# Patient Record
Sex: Male | Born: 1955 | Race: Black or African American | Hispanic: No | Marital: Single
Health system: Southern US, Community
[De-identification: ages and names within clinical notes are randomized; demographics above are authoritative.]

---

## 1999-07-18 ENCOUNTER — Encounter: Payer: Self-pay | Admitting: General Practice

## 1999-07-18 ENCOUNTER — Encounter: Admission: RE | Admit: 1999-07-18 | Discharge: 1999-07-18 | Payer: Self-pay | Admitting: General Practice

## 2011-10-29 ENCOUNTER — Emergency Department (HOSPITAL_COMMUNITY)
Admission: EM | Admit: 2011-10-29 | Discharge: 2011-10-29 | Disposition: A | Discharge status: Home / Self Care | Payer: No Typology Code available for payment source | Attending: Emergency Medicine | Admitting: Emergency Medicine

## 2011-10-29 ENCOUNTER — Emergency Department (HOSPITAL_COMMUNITY): Payer: No Typology Code available for payment source

## 2011-10-29 ENCOUNTER — Encounter (HOSPITAL_COMMUNITY): Payer: Self-pay | Admitting: Emergency Medicine

## 2011-10-29 DIAGNOSIS — M542 Cervicalgia: Secondary | ICD-10-CM | POA: Insufficient documentation

## 2011-10-29 DIAGNOSIS — M545 Low back pain, unspecified: Secondary | ICD-10-CM | POA: Insufficient documentation

## 2011-10-29 DIAGNOSIS — M549 Dorsalgia, unspecified: Secondary | ICD-10-CM

## 2011-10-29 DIAGNOSIS — M25569 Pain in unspecified knee: Secondary | ICD-10-CM | POA: Insufficient documentation

## 2011-10-29 MED ORDER — IBUPROFEN 600 MG PO TABS
600.0000 mg | ORAL_TABLET | Freq: Four times a day (QID) | ORAL | Status: AC | PRN
Start: 2011-10-29 — End: 2011-11-08

## 2011-10-29 MED ORDER — HYDROCODONE-ACETAMINOPHEN 5-500 MG PO TABS: 1.0000 | ORAL_TABLET | Freq: Four times a day (QID) | ORAL | Status: AC | PRN

## 2011-10-29 MED ORDER — HYDROCODONE-ACETAMINOPHEN 5-325 MG PO TABS: 1.0000 | ORAL_TABLET | Freq: Once | ORAL | Status: DC

## 2011-10-29 NOTE — ED Notes (Signed)
Patient transported to X-ray 
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
## 2011-10-29 NOTE — ED Notes (Signed)
Pt calling for ride home 
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
## 2012-11-24 ENCOUNTER — Emergency Department: Payer: Self-pay | Admitting: Emergency Medicine

## 2012-11-24 LAB — URINALYSIS, COMPLETE
Bacteria: NONE SEEN
Bilirubin,UR: NEGATIVE
Blood: NEGATIVE
Leukocyte Esterase: NEGATIVE
Nitrite: NEGATIVE
Ph: 5 (ref 4.5–8.0)
Protein: NEGATIVE
Specific Gravity: 1.028 (ref 1.003–1.030)
Squamous Epithelial: NONE SEEN
WBC UR: 2 /HPF (ref 0–5)

## 2012-11-24 LAB — COMPREHENSIVE METABOLIC PANEL
Albumin: 3.9 g/dL (ref 3.4–5.0)
Alkaline Phosphatase: 136 U/L (ref 50–136)
Anion Gap: 7 (ref 7–16)
BUN: 16 mg/dL (ref 7–18)
Bilirubin,Total: 0.4 mg/dL (ref 0.2–1.0)
Calcium, Total: 9.3 mg/dL (ref 8.5–10.1)
Chloride: 107 mmol/L (ref 98–107)
Co2: 28 mmol/L (ref 21–32)
Creatinine: 1.02 mg/dL (ref 0.60–1.30)
Glucose: 131 mg/dL — ABNORMAL HIGH (ref 65–99)
Osmolality: 286 (ref 275–301)
Potassium: 3.7 mmol/L (ref 3.5–5.1)
SGOT(AST): 25 U/L (ref 15–37)
SGPT (ALT): 25 U/L (ref 12–78)
Sodium: 142 mmol/L (ref 136–145)
Total Protein: 8 g/dL (ref 6.4–8.2)

## 2012-11-24 LAB — CBC
HCT: 42.8 % (ref 40.0–52.0)
HGB: 14.2 g/dL (ref 13.0–18.0)
MCH: 29.4 pg (ref 26.0–34.0)
MCHC: 33.2 g/dL (ref 32.0–36.0)
MCV: 89 fL (ref 80–100)
Platelet: 279 10*3/uL (ref 150–440)
RBC: 4.83 10*6/uL (ref 4.40–5.90)
RDW: 14.9 % — ABNORMAL HIGH (ref 11.5–14.5)
WBC: 3.7 10*3/uL — ABNORMAL LOW (ref 3.8–10.6)

## 2016-10-27 ENCOUNTER — Encounter (HOSPITAL_COMMUNITY): Payer: Self-pay

## 2016-10-27 ENCOUNTER — Emergency Department (HOSPITAL_COMMUNITY)
Admission: EM | Admit: 2016-10-27 | Discharge: 2016-10-27 | Disposition: A | Discharge status: Home / Self Care | Payer: No Typology Code available for payment source | Attending: Emergency Medicine | Admitting: Emergency Medicine

## 2016-10-27 DIAGNOSIS — Y92410 Unspecified street and highway as the place of occurrence of the external cause: Secondary | ICD-10-CM | POA: Diagnosis not present

## 2016-10-27 DIAGNOSIS — M7918 Myalgia, other site: Secondary | ICD-10-CM

## 2016-10-27 DIAGNOSIS — M791 Myalgia: Secondary | ICD-10-CM | POA: Diagnosis not present

## 2016-10-27 DIAGNOSIS — Y939 Activity, unspecified: Secondary | ICD-10-CM | POA: Diagnosis not present

## 2016-10-27 DIAGNOSIS — M545 Low back pain: Secondary | ICD-10-CM | POA: Insufficient documentation

## 2016-10-27 DIAGNOSIS — Y999 Unspecified external cause status: Secondary | ICD-10-CM | POA: Insufficient documentation

## 2016-10-27 MED ORDER — NAPROXEN 500 MG PO TABS: 500.0000 mg | ORAL_TABLET | Freq: Two times a day (BID) | ORAL | 0 refills | Status: DC

## 2016-10-27 MED ORDER — METHOCARBAMOL 500 MG PO TABS: 500.0000 mg | ORAL_TABLET | Freq: Every evening | ORAL | 0 refills | Status: DC | PRN
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
## 2016-10-27 NOTE — ED Notes (Signed)
Pt states understanding discharge instructions and electronic signature pad broken

## 2017-05-30 ENCOUNTER — Emergency Department (HOSPITAL_COMMUNITY)
Admission: EM | Admit: 2017-05-30 | Discharge: 2017-05-30 | Disposition: A | Discharge status: Home / Self Care | Payer: No Typology Code available for payment source | Attending: Emergency Medicine | Admitting: Emergency Medicine

## 2017-05-30 DIAGNOSIS — Y999 Unspecified external cause status: Secondary | ICD-10-CM | POA: Diagnosis not present

## 2017-05-30 DIAGNOSIS — Y92410 Unspecified street and highway as the place of occurrence of the external cause: Secondary | ICD-10-CM | POA: Insufficient documentation

## 2017-05-30 DIAGNOSIS — Z79899 Other long term (current) drug therapy: Secondary | ICD-10-CM | POA: Diagnosis not present

## 2017-05-30 DIAGNOSIS — M791 Myalgia: Secondary | ICD-10-CM | POA: Insufficient documentation

## 2017-05-30 DIAGNOSIS — Y9389 Activity, other specified: Secondary | ICD-10-CM | POA: Diagnosis not present

## 2017-05-30 DIAGNOSIS — R0781 Pleurodynia: Secondary | ICD-10-CM | POA: Insufficient documentation

## 2017-05-30 DIAGNOSIS — M7918 Myalgia, other site: Secondary | ICD-10-CM

## 2017-05-30 MED ORDER — IBUPROFEN 600 MG PO TABS: 600.0000 mg | ORAL_TABLET | Freq: Four times a day (QID) | ORAL | 0 refills | Status: DC | PRN

## 2017-05-30 NOTE — ED Notes (Signed)
Patient verbalized understanding of discharge instructions and denies any further needs or questions at this time. VS stable. Patient ambulatory with steady gait.  
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
## 2021-04-05 ENCOUNTER — Emergency Department (HOSPITAL_COMMUNITY): Payer: No Typology Code available for payment source

## 2021-04-05 ENCOUNTER — Other Ambulatory Visit: Payer: Self-pay

## 2021-04-05 ENCOUNTER — Emergency Department (HOSPITAL_COMMUNITY)
Admission: EM | Admit: 2021-04-05 | Discharge: 2021-04-05 | Disposition: A | Discharge status: Home / Self Care | Payer: No Typology Code available for payment source | Attending: Emergency Medicine | Admitting: Emergency Medicine

## 2021-04-05 ENCOUNTER — Encounter (HOSPITAL_COMMUNITY): Payer: Self-pay

## 2021-04-05 DIAGNOSIS — M25571 Pain in right ankle and joints of right foot: Secondary | ICD-10-CM | POA: Insufficient documentation

## 2021-04-05 DIAGNOSIS — S59912A Unspecified injury of left forearm, initial encounter: Secondary | ICD-10-CM | POA: Diagnosis present

## 2021-04-05 DIAGNOSIS — M542 Cervicalgia: Secondary | ICD-10-CM | POA: Diagnosis not present

## 2021-04-05 DIAGNOSIS — M791 Myalgia, unspecified site: Secondary | ICD-10-CM | POA: Insufficient documentation

## 2021-04-05 DIAGNOSIS — M79662 Pain in left lower leg: Secondary | ICD-10-CM | POA: Diagnosis not present

## 2021-04-05 DIAGNOSIS — M7918 Myalgia, other site: Secondary | ICD-10-CM

## 2021-04-05 DIAGNOSIS — T148XXA Other injury of unspecified body region, initial encounter: Secondary | ICD-10-CM

## 2021-04-05 DIAGNOSIS — M25512 Pain in left shoulder: Secondary | ICD-10-CM | POA: Diagnosis not present

## 2021-04-05 DIAGNOSIS — Y92410 Unspecified street and highway as the place of occurrence of the external cause: Secondary | ICD-10-CM | POA: Insufficient documentation

## 2021-04-05 DIAGNOSIS — S50812A Abrasion of left forearm, initial encounter: Secondary | ICD-10-CM | POA: Insufficient documentation

## 2021-04-05 MED ORDER — METHOCARBAMOL 500 MG PO TABS: 500.0000 mg | ORAL_TABLET | Freq: Two times a day (BID) | ORAL | 0 refills | Status: AC

## 2021-04-05 MED ORDER — BACITRACIN ZINC 500 UNIT/GM EX OINT: TOPICAL_OINTMENT | Freq: Once | CUTANEOUS | Status: DC

## 2021-04-05 MED ORDER — OXYCODONE-ACETAMINOPHEN 5-325 MG PO TABS
1.0000 | ORAL_TABLET | Freq: Once | ORAL | Status: AC
  Administered 2021-04-05: 1 via ORAL
  Filled 2021-04-05: qty 1
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
## 2021-04-05 NOTE — ED Notes (Signed)
Patient transported to X-ray 
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
## 2022-06-11 ENCOUNTER — Inpatient Hospital Stay (HOSPITAL_COMMUNITY)
Admission: EM | Admit: 2022-06-11 | Discharge: 2022-06-29 | DRG: 064 | Disposition: E | Payer: Self-pay | Attending: Internal Medicine | Admitting: Internal Medicine

## 2022-06-11 ENCOUNTER — Emergency Department (HOSPITAL_COMMUNITY): Payer: Self-pay

## 2022-06-11 ENCOUNTER — Other Ambulatory Visit: Payer: Self-pay

## 2022-06-11 DIAGNOSIS — E876 Hypokalemia: Secondary | ICD-10-CM | POA: Diagnosis present

## 2022-06-11 DIAGNOSIS — Z781 Physical restraint status: Secondary | ICD-10-CM

## 2022-06-11 DIAGNOSIS — E860 Dehydration: Secondary | ICD-10-CM | POA: Diagnosis present

## 2022-06-11 DIAGNOSIS — R911 Solitary pulmonary nodule: Secondary | ICD-10-CM | POA: Diagnosis present

## 2022-06-11 DIAGNOSIS — I429 Cardiomyopathy, unspecified: Secondary | ICD-10-CM | POA: Diagnosis present

## 2022-06-11 DIAGNOSIS — E1165 Type 2 diabetes mellitus with hyperglycemia: Secondary | ICD-10-CM | POA: Diagnosis present

## 2022-06-11 DIAGNOSIS — I63413 Cerebral infarction due to embolism of bilateral middle cerebral arteries: Secondary | ICD-10-CM | POA: Diagnosis present

## 2022-06-11 DIAGNOSIS — I248 Other forms of acute ischemic heart disease: Secondary | ICD-10-CM | POA: Diagnosis present

## 2022-06-11 DIAGNOSIS — I63433 Cerebral infarction due to embolism of bilateral posterior cerebral arteries: Principal | ICD-10-CM | POA: Diagnosis present

## 2022-06-11 DIAGNOSIS — Z66 Do not resuscitate: Secondary | ICD-10-CM | POA: Diagnosis present

## 2022-06-11 DIAGNOSIS — E785 Hyperlipidemia, unspecified: Secondary | ICD-10-CM | POA: Diagnosis present

## 2022-06-11 DIAGNOSIS — D735 Infarction of spleen: Secondary | ICD-10-CM | POA: Diagnosis present

## 2022-06-11 DIAGNOSIS — Z681 Body mass index (BMI) 19 or less, adult: Secondary | ICD-10-CM

## 2022-06-11 DIAGNOSIS — R64 Cachexia: Secondary | ICD-10-CM | POA: Diagnosis present

## 2022-06-11 DIAGNOSIS — M6282 Rhabdomyolysis: Secondary | ICD-10-CM

## 2022-06-11 DIAGNOSIS — R680 Hypothermia, not associated with low environmental temperature: Secondary | ICD-10-CM | POA: Diagnosis present

## 2022-06-11 DIAGNOSIS — I5041 Acute combined systolic (congestive) and diastolic (congestive) heart failure: Secondary | ICD-10-CM | POA: Diagnosis present

## 2022-06-11 DIAGNOSIS — I96 Gangrene, not elsewhere classified: Secondary | ICD-10-CM

## 2022-06-11 DIAGNOSIS — I493 Ventricular premature depolarization: Secondary | ICD-10-CM | POA: Diagnosis not present

## 2022-06-11 DIAGNOSIS — I2699 Other pulmonary embolism without acute cor pulmonale: Secondary | ICD-10-CM | POA: Diagnosis present

## 2022-06-11 DIAGNOSIS — I513 Intracardiac thrombosis, not elsewhere classified: Secondary | ICD-10-CM | POA: Diagnosis present

## 2022-06-11 DIAGNOSIS — R7881 Bacteremia: Secondary | ICD-10-CM | POA: Diagnosis present

## 2022-06-11 DIAGNOSIS — I998 Other disorder of circulatory system: Secondary | ICD-10-CM

## 2022-06-11 DIAGNOSIS — I82442 Acute embolism and thrombosis of left tibial vein: Secondary | ICD-10-CM | POA: Diagnosis present

## 2022-06-11 DIAGNOSIS — E870 Hyperosmolality and hypernatremia: Secondary | ICD-10-CM | POA: Diagnosis present

## 2022-06-11 DIAGNOSIS — E119 Type 2 diabetes mellitus without complications: Secondary | ICD-10-CM

## 2022-06-11 DIAGNOSIS — I82409 Acute embolism and thrombosis of unspecified deep veins of unspecified lower extremity: Secondary | ICD-10-CM

## 2022-06-11 DIAGNOSIS — R54 Age-related physical debility: Secondary | ICD-10-CM | POA: Diagnosis present

## 2022-06-11 DIAGNOSIS — R7401 Elevation of levels of liver transaminase levels: Secondary | ICD-10-CM | POA: Diagnosis present

## 2022-06-11 DIAGNOSIS — Y92009 Unspecified place in unspecified non-institutional (private) residence as the place of occurrence of the external cause: Secondary | ICD-10-CM

## 2022-06-11 DIAGNOSIS — E872 Acidosis, unspecified: Secondary | ICD-10-CM | POA: Diagnosis present

## 2022-06-11 DIAGNOSIS — T796XXA Traumatic ischemia of muscle, initial encounter: Secondary | ICD-10-CM | POA: Diagnosis present

## 2022-06-11 DIAGNOSIS — E871 Hypo-osmolality and hyponatremia: Secondary | ICD-10-CM | POA: Diagnosis not present

## 2022-06-11 DIAGNOSIS — I70203 Unspecified atherosclerosis of native arteries of extremities, bilateral legs: Secondary | ICD-10-CM | POA: Diagnosis present

## 2022-06-11 DIAGNOSIS — N179 Acute kidney failure, unspecified: Secondary | ICD-10-CM | POA: Diagnosis present

## 2022-06-11 DIAGNOSIS — N280 Ischemia and infarction of kidney: Secondary | ICD-10-CM | POA: Diagnosis present

## 2022-06-11 DIAGNOSIS — Z515 Encounter for palliative care: Secondary | ICD-10-CM

## 2022-06-11 DIAGNOSIS — E11649 Type 2 diabetes mellitus with hypoglycemia without coma: Secondary | ICD-10-CM | POA: Diagnosis not present

## 2022-06-11 DIAGNOSIS — R1310 Dysphagia, unspecified: Secondary | ICD-10-CM | POA: Diagnosis present

## 2022-06-11 DIAGNOSIS — R4182 Altered mental status, unspecified: Principal | ICD-10-CM

## 2022-06-11 DIAGNOSIS — R29717 NIHSS score 17: Secondary | ICD-10-CM | POA: Diagnosis present

## 2022-06-11 DIAGNOSIS — G9341 Metabolic encephalopathy: Secondary | ICD-10-CM | POA: Diagnosis present

## 2022-06-11 DIAGNOSIS — E43 Unspecified severe protein-calorie malnutrition: Secondary | ICD-10-CM | POA: Diagnosis present

## 2022-06-11 DIAGNOSIS — E1152 Type 2 diabetes mellitus with diabetic peripheral angiopathy with gangrene: Secondary | ICD-10-CM | POA: Diagnosis present

## 2022-06-11 DIAGNOSIS — I70209 Unspecified atherosclerosis of native arteries of extremities, unspecified extremity: Secondary | ICD-10-CM

## 2022-06-11 DIAGNOSIS — B9561 Methicillin susceptible Staphylococcus aureus infection as the cause of diseases classified elsewhere: Secondary | ICD-10-CM | POA: Diagnosis present

## 2022-06-11 DIAGNOSIS — I82452 Acute embolism and thrombosis of left peroneal vein: Secondary | ICD-10-CM | POA: Diagnosis present

## 2022-06-11 DIAGNOSIS — Z20822 Contact with and (suspected) exposure to covid-19: Secondary | ICD-10-CM | POA: Diagnosis present

## 2022-06-11 DIAGNOSIS — I6340 Cerebral infarction due to embolism of unspecified cerebral artery: Secondary | ICD-10-CM

## 2022-06-11 DIAGNOSIS — L8990 Pressure ulcer of unspecified site, unspecified stage: Secondary | ICD-10-CM | POA: Insufficient documentation

## 2022-06-11 DIAGNOSIS — I82432 Acute embolism and thrombosis of left popliteal vein: Secondary | ICD-10-CM | POA: Diagnosis present

## 2022-06-11 LAB — I-STAT VENOUS BLOOD GAS, ED
Acid-Base Excess: 4 mmol/L — ABNORMAL HIGH (ref 0.0–2.0)
Acid-Base Excess: 7 mmol/L — ABNORMAL HIGH (ref 0.0–2.0)
Bicarbonate: 29 mmol/L — ABNORMAL HIGH (ref 20.0–28.0)
Bicarbonate: 32.3 mmol/L — ABNORMAL HIGH (ref 20.0–28.0)
Calcium, Ion: 1.04 mmol/L — ABNORMAL LOW (ref 1.15–1.40)
Calcium, Ion: 0.97 mmol/L — ABNORMAL LOW (ref 1.15–1.40)
HCT: 48 % (ref 39.0–52.0)
HCT: 42 % (ref 39.0–52.0)
Hemoglobin: 16.3 g/dL (ref 13.0–17.0)
Hemoglobin: 14.3 g/dL (ref 13.0–17.0)
O2 Saturation: 91 %
O2 Saturation: 98 %
Potassium: 3.3 mmol/L — ABNORMAL LOW (ref 3.5–5.1)
Potassium: 6.7 mmol/L (ref 3.5–5.1)
Sodium: 165 mmol/L (ref 135–145)
Sodium: 161 mmol/L (ref 135–145)
TCO2: 30 mmol/L (ref 22–32)
TCO2: 34 mmol/L — ABNORMAL HIGH (ref 22–32)
pCO2, Ven: 44 mmHg (ref 44–60)
pCO2, Ven: 44.9 mmHg (ref 44–60)
pH, Ven: 7.427 (ref 7.25–7.43)
pH, Ven: 7.464 — ABNORMAL HIGH (ref 7.25–7.43)
pO2, Ven: 59 mmHg — ABNORMAL HIGH (ref 32–45)
pO2, Ven: 101 mmHg — ABNORMAL HIGH (ref 32–45)

## 2022-06-11 LAB — URINALYSIS, ROUTINE W REFLEX MICROSCOPIC
Bilirubin Urine: NEGATIVE
Glucose, UA: >=500 mg/dL — AB
Ketones, ur: 20 mg/dL — AB
Leukocytes,Ua: NEGATIVE
Nitrite: NEGATIVE
Protein, ur: 30 mg/dL — AB
Specific Gravity, Urine: 1.027 (ref 1.005–1.030)
pH: 5 (ref 5.0–8.0)

## 2022-06-11 LAB — BASIC METABOLIC PANEL
Anion gap: 13 (ref 5–15)
BUN: 13 mg/dL (ref 8–23)
CO2: 27 mmol/L (ref 22–32)
Calcium: 8.2 mg/dL — ABNORMAL LOW (ref 8.9–10.3)
Chloride: 123 mmol/L — ABNORMAL HIGH (ref 98–111)
Creatinine, Ser: 0.82 mg/dL (ref 0.61–1.24)
GFR, Estimated: >60 mL/min (ref >60)
Glucose, Bld: 346 mg/dL — ABNORMAL HIGH (ref 70–99)
Potassium: 2.9 mmol/L — ABNORMAL LOW (ref 3.5–5.1)
Sodium: 163 mmol/L (ref 135–145)

## 2022-06-11 LAB — COMPREHENSIVE METABOLIC PANEL
ALT: 87 U/L — ABNORMAL HIGH (ref 0–44)
AST: 77 U/L — ABNORMAL HIGH (ref 15–41)
Albumin: 2.4 g/dL — ABNORMAL LOW (ref 3.5–5.0)
Alkaline Phosphatase: 124 U/L (ref 38–126)
Anion gap: 12 (ref 5–15)
BUN: 14 mg/dL (ref 8–23)
CO2: 26 mmol/L (ref 22–32)
Calcium: 8.5 mg/dL — ABNORMAL LOW (ref 8.9–10.3)
Chloride: 126 mmol/L — ABNORMAL HIGH (ref 98–111)
Creatinine, Ser: 0.8 mg/dL (ref 0.61–1.24)
GFR, Estimated: >60 mL/min (ref >60)
Glucose, Bld: 327 mg/dL — ABNORMAL HIGH (ref 70–99)
Potassium: 3.6 mmol/L (ref 3.5–5.1)
Sodium: 164 mmol/L (ref 135–145)
Total Bilirubin: 1.9 mg/dL — ABNORMAL HIGH (ref 0.3–1.2)
Total Protein: 6.9 g/dL (ref 6.5–8.1)

## 2022-06-11 LAB — RAPID URINE DRUG SCREEN, HOSP PERFORMED
Amphetamines: NOT DETECTED
Barbiturates: NOT DETECTED
Benzodiazepines: NOT DETECTED
Cocaine: NOT DETECTED
Opiates: NOT DETECTED
Tetrahydrocannabinol: NOT DETECTED

## 2022-06-11 LAB — CK: Total CK: 2897 U/L — ABNORMAL HIGH (ref 49–397)

## 2022-06-11 LAB — LACTIC ACID, PLASMA
Lactic Acid, Venous: 2.3 mmol/L (ref 0.5–1.9)
Lactic Acid, Venous: 2.1 mmol/L (ref 0.5–1.9)

## 2022-06-11 LAB — DIC (DISSEMINATED INTRAVASCULAR COAGULATION)PANEL
D-Dimer, Quant: 11.34 ug/mL-FEU — ABNORMAL HIGH (ref 0.00–0.50)
Fibrinogen: 714 mg/dL — ABNORMAL HIGH (ref 210–475)
INR: 1.3 — ABNORMAL HIGH (ref 0.8–1.2)
Platelets: 186 10*3/uL (ref 150–400)
Prothrombin Time: 16 seconds — ABNORMAL HIGH (ref 11.4–15.2)
Smear Review: NONE SEEN
aPTT: 25 seconds (ref 24–36)

## 2022-06-11 LAB — CBG MONITORING, ED
Glucose-Capillary: 295 mg/dL — ABNORMAL HIGH (ref 70–99)
Glucose-Capillary: 304 mg/dL — ABNORMAL HIGH (ref 70–99)

## 2022-06-11 LAB — CBC WITH DIFFERENTIAL/PLATELET
Abs Immature Granulocytes: 0.09 10*3/uL — ABNORMAL HIGH (ref 0.00–0.07)
Basophils Absolute: 0 10*3/uL (ref 0.0–0.1)
Basophils Relative: 0 %
Eosinophils Absolute: 0 10*3/uL (ref 0.0–0.5)
Eosinophils Relative: 0 %
HCT: 52.8 % — ABNORMAL HIGH (ref 39.0–52.0)
Hemoglobin: 15.8 g/dL (ref 13.0–17.0)
Immature Granulocytes: 1 %
Lymphocytes Relative: 7 %
Lymphs Abs: 0.7 10*3/uL (ref 0.7–4.0)
MCH: 30.3 pg (ref 26.0–34.0)
MCHC: 29.9 g/dL — ABNORMAL LOW (ref 30.0–36.0)
MCV: 101.1 fL — ABNORMAL HIGH (ref 80.0–100.0)
Monocytes Absolute: 0.4 10*3/uL (ref 0.1–1.0)
Monocytes Relative: 4 %
Neutro Abs: 9 10*3/uL — ABNORMAL HIGH (ref 1.7–7.7)
Neutrophils Relative %: 88 %
Platelets: 195 10*3/uL (ref 150–400)
RBC: 5.22 MIL/uL (ref 4.22–5.81)
RDW: 13.8 % (ref 11.5–15.5)
WBC: 10.3 10*3/uL (ref 4.0–10.5)
nRBC: 0 % (ref 0.0–0.2)

## 2022-06-11 LAB — RESP PANEL BY RT-PCR (FLU A&B, COVID) ARPGX2
Influenza A by PCR: NEGATIVE
Influenza B by PCR: NEGATIVE
SARS Coronavirus 2 by RT PCR: NEGATIVE

## 2022-06-11 LAB — PROTIME-INR
INR: 1.2 (ref 0.8–1.2)
Prothrombin Time: 15.1 seconds (ref 11.4–15.2)

## 2022-06-11 LAB — SODIUM
Sodium: 163 mmol/L (ref 135–145)
Sodium: 161 mmol/L (ref 135–145)
Sodium: 164 mmol/L (ref 135–145)

## 2022-06-11 LAB — HEPARIN LEVEL (UNFRACTIONATED): Heparin Unfractionated: 0.49 IU/mL (ref 0.30–0.70)

## 2022-06-11 LAB — ETHANOL: Alcohol, Ethyl (B): <10 mg/dL (ref <10)

## 2022-06-11 LAB — MAGNESIUM: Magnesium: 2 mg/dL (ref 1.7–2.4)

## 2022-06-11 LAB — APTT: aPTT: 24 seconds (ref 24–36)

## 2022-06-11 LAB — TROPONIN I (HIGH SENSITIVITY)
Troponin I (High Sensitivity): 596 ng/L (ref <18)
Troponin I (High Sensitivity): 690 ng/L (ref <18)

## 2022-06-11 MED ORDER — SODIUM CHLORIDE 0.9 % IV SOLN
2.0000 g | Freq: Once | INTRAVENOUS | Status: AC
  Administered 2022-06-11: 2 g via INTRAVENOUS
  Filled 2022-06-11: qty 12.5

## 2022-06-11 MED ORDER — HEPARIN BOLUS VIA INFUSION
4000.0000 [IU] | Freq: Once | INTRAVENOUS | Status: AC
  Administered 2022-06-11: 4000 [IU] via INTRAVENOUS
  Filled 2022-06-11: qty 4000

## 2022-06-11 MED ORDER — IOHEXOL 350 MG/ML SOLN
100.0000 mL | Freq: Once | INTRAVENOUS | Status: AC | PRN
  Administered 2022-06-11: 75 mL via INTRAVENOUS

## 2022-06-11 MED ORDER — LACTATED RINGERS IV SOLN: INTRAVENOUS | Status: DC

## 2022-06-11 MED ORDER — FLUMAZENIL 0.5 MG/5ML IV SOLN
0.2000 mg | Freq: Once | INTRAVENOUS | Status: AC
  Administered 2022-06-11: 0.2 mg via INTRAVENOUS
  Filled 2022-06-11: qty 5

## 2022-06-11 MED ORDER — INSULIN ASPART 100 UNIT/ML IJ SOLN
10.0000 [IU] | Freq: Once | INTRAMUSCULAR | Status: AC
  Administered 2022-06-11: 10 [IU] via SUBCUTANEOUS

## 2022-06-11 MED ORDER — VANCOMYCIN HCL 500 MG/100ML IV SOLN: 500.0000 mg | Freq: Two times a day (BID) | INTRAVENOUS | Status: DC

## 2022-06-11 MED ORDER — VANCOMYCIN HCL IN DEXTROSE 1-5 GM/200ML-% IV SOLN
1000.0000 mg | Freq: Once | INTRAVENOUS | Status: AC
  Administered 2022-06-11: 1000 mg via INTRAVENOUS
  Filled 2022-06-11: qty 200

## 2022-06-11 MED ORDER — DOCUSATE SODIUM 100 MG PO CAPS: 100.0000 mg | ORAL_CAPSULE | Freq: Two times a day (BID) | ORAL | Status: DC | PRN

## 2022-06-11 MED ORDER — HEPARIN (PORCINE) 25000 UT/250ML-% IV SOLN
950.0000 [IU]/h | INTRAVENOUS | Status: DC
  Administered 2022-06-11: 950 [IU]/h via INTRAVENOUS
  Filled 2022-06-11: qty 250

## 2022-06-11 MED ORDER — LACTATED RINGERS IV BOLUS
2000.0000 mL | Freq: Once | INTRAVENOUS | Status: AC
  Administered 2022-06-11: 2000 mL via INTRAVENOUS

## 2022-06-11 MED ORDER — LORAZEPAM 2 MG/ML IJ SOLN
1.0000 mg | Freq: Once | INTRAMUSCULAR | Status: AC
  Administered 2022-06-11: 1 mg via INTRAVENOUS
  Filled 2022-06-11: qty 1

## 2022-06-11 MED ORDER — DEXTROSE 5 % IV SOLN: INTRAVENOUS | Status: DC

## 2022-06-11 MED ORDER — POLYETHYLENE GLYCOL 3350 17 G PO PACK: 17.0000 g | PACK | Freq: Every day | ORAL | Status: DC | PRN

## 2022-06-11 MED ORDER — CHLORHEXIDINE GLUCONATE CLOTH 2 % EX PADS
6.0000 | MEDICATED_PAD | Freq: Once | CUTANEOUS | Status: AC
  Administered 2022-06-11: 6 via TOPICAL

## 2022-06-11 MED ORDER — INSULIN ASPART 100 UNIT/ML IJ SOLN
2.0000 [IU] | INTRAMUSCULAR | Status: DC
  Administered 2022-06-12 (×5): 4 [IU] via SUBCUTANEOUS
  Administered 2022-06-12: 2 [IU] via SUBCUTANEOUS
  Administered 2022-06-13: 4 [IU] via SUBCUTANEOUS
  Administered 2022-06-13 (×2): 2 [IU] via SUBCUTANEOUS
  Administered 2022-06-13: 4 [IU] via SUBCUTANEOUS
  Administered 2022-06-13: 2 [IU] via SUBCUTANEOUS
  Administered 2022-06-14 (×2): 4 [IU] via SUBCUTANEOUS
  Administered 2022-06-14: 2 [IU] via SUBCUTANEOUS
  Administered 2022-06-14 (×3): 4 [IU] via SUBCUTANEOUS
  Administered 2022-06-15: 2 [IU] via SUBCUTANEOUS
  Administered 2022-06-15: 6 [IU] via SUBCUTANEOUS
  Administered 2022-06-15: 2 [IU] via SUBCUTANEOUS
  Administered 2022-06-15 (×2): 4 [IU] via SUBCUTANEOUS
  Administered 2022-06-15: 6 [IU] via SUBCUTANEOUS
  Administered 2022-06-16: 4 [IU] via SUBCUTANEOUS
  Administered 2022-06-16: 6 [IU] via SUBCUTANEOUS
  Administered 2022-06-16: 4 [IU] via SUBCUTANEOUS
  Administered 2022-06-16 (×2): 6 [IU] via SUBCUTANEOUS
  Administered 2022-06-17: 2 [IU] via SUBCUTANEOUS
  Administered 2022-06-17: 4 [IU] via SUBCUTANEOUS

## 2022-06-11 MED ORDER — CHLORHEXIDINE GLUCONATE CLOTH 2 % EX PADS
6.0000 | MEDICATED_PAD | Freq: Every morning | CUTANEOUS | Status: DC
  Administered 2022-06-12 – 2022-06-17 (×6): 6 via TOPICAL

## 2022-06-11 NOTE — Progress Notes (Signed)
eLink Physician-Brief Progress Note Patient Name: Alexander Greene DOB: [birth year] MRN: [id number]   Date of Service  06/12/2022  HPI/Events of Note  Notified of hyperglycemia at 304 and  hypernatremia.  Na 165 --> 161 since 1100 today.   eICU Interventions  Start on insulin sliding scale for glucose control.  Start LR @ 50cc/hr.  Follow serum Na closely.     Intervention Category Intermediate Interventions: Electrolyte abnormality - evaluation and management;Other:  Larinda Buttery 05/30/2022, 9:46 PM

## 2022-06-11 NOTE — ED Notes (Signed)
Critical care at bedside  
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
## 2022-06-11 NOTE — Progress Notes (Signed)
ANTICOAGULATION CONSULT NOTE  Pharmacy Consult for heparin Indication: pulmonary embolus  No Known Allergies  Patient Measurements: Height: 5\' 11"  (180.3 cm) Weight: 54.4 kg (119 lb 14.9 oz) IBW/kg (Calculated) : 75.3 Heparin Dosing Weight: 54.4kg  Vital Signs: Temp: 98.9 F (37.2 C) (09/13 2212) Temp Source: Oral (09/13 2212) BP: 110/83 (09/13 2300) Pulse Rate: 95 (09/13 2300)  Labs: Recent Labs    06/23/2022 1108 2022/06/23 1139 June 23, 2022 1312 06/23/22 1517 06/23/2022 1548 06-23-22 1715 06-23-2022 2227  HGB 15.8 16.3  --  14.3  --   --   --   HCT 52.8* 48.0  --  42.0  --   --   --   PLT 195  --   --   --  186  --   --   APTT 24  --   --   --  25  --   --   LABPROT 15.1  --   --   --  16.0*  --   --   INR 1.2  --   --   --  1.3*  --   --   HEPARINUNFRC  --   --   --   --   --   --  0.49  CREATININE 0.80  --   --   --   --  0.82  --   CKTOTAL 2,897*  --   --   --   --   --   --   TROPONINIHS  --   --  596* 690*  --   --   --      Estimated Creatinine Clearance: 69.1 mL/min (by C-G formula based on SCr of 0.82 mg/dL).   Medical History: No past medical history on file.  Medications:  Medications Prior to Admission  Medication Sig Dispense Refill Last Dose   ibuprofen (ADVIL,MOTRIN) 600 MG tablet Take 1 tablet (600 mg total) by mouth every 6 (six) hours as needed. (Patient not taking: Reported on 04/05/2021) 30 tablet 0    methocarbamol (ROBAXIN) 500 MG tablet Take 1 tablet (500 mg total) by mouth 2 (two) times daily. 20 tablet 0    naproxen (NAPROSYN) 500 MG tablet Take 1 tablet (500 mg total) by mouth 2 (two) times daily with a meal. (Patient not taking: Reported on 04/05/2021) 30 tablet 0    Scheduled:   [START ON 06/12/2022] Chlorhexidine Gluconate Cloth  6 each Topical q morning   insulin aspart  2-6 Units Subcutaneous Q4H    Assessment: 66 yoM who was brought in by EMS, found down at his home and had altered mental status. PMH is unknown at this time. CT showed  multiple clots. Pharmacy is being consulted to dose heparin for pulmonary embolism. Hgb and plts stable. No PTA anticoagulants.  Initial heparin level is therapeutic at 0.47 - will check confirmatory level with am labs.  Goal of Therapy:  Heparin level 0.3-0.7 units/ml Monitor platelets by anticoagulation protocol: Yes   Plan:  Continue heparin 950 units/h Daily heparin level and CBC  06/14/2022, PharmD, [place], [facility] Clinical Pharmacist [phone] Please check AMION for all [facility] Pharmacy numbers Jun 23, 2022

## 2022-06-11 NOTE — Progress Notes (Signed)
eLink Physician-Brief Progress Note Patient Name: TORRIN CRIHFIELD DOB: 08/17/1956 MRN: [id number]   Date of Service  06/24/2022  HPI/Events of Note  66/M brought in after he was found unresponsive by his neighbor.  Pt opens his eyes but does not follow commands.  He was noted to be hypernatremic, hyperglycemic, elevated CK and troponin.  Pt also with lactic acidosis at 2.3. He had ischemic bilateral lower extremities with no palpable pulses.  Pt given IVFs and started on heparin gtt.   BP 136/83, HR 103, RR 15, O2 sats 97% on RA.   Na 165 --> 163 --> 161 K 6.7 --> 2.9.  Troponin 596--> 690, CK 2897 Lactate 2.1  eICU Interventions  Admit to ICU.  Start on insulin sliding scale for glucose control. Check POC q4hrs.  Start on LR @ 50cc/hr.  Trend troponin, CK, lactate.  Continue heparin gtt.  Vascular surgery consulted. Ok to apply wet to dry dressing on bilateral lower ectremitites.  Pt is on heparin gtt.  Recheck BMP tonight and will correct electrolytes as needed.     Intervention Category Evaluation Type: New Patient Evaluation  Larinda Buttery 06/02/2022, 10:20 PM

## 2022-06-11 NOTE — ED Triage Notes (Signed)
Pt BIB EMS from home. Pt was found down on the ground by his roommate. Last seen two days ago. No blood thinners, no known medical history. Pt has infected wounds to the legs.   CBG 400 143/80 Given on NS

## 2022-06-11 NOTE — ED Notes (Signed)
EDP and Vascular at bedside.

## 2022-06-11 NOTE — Progress Notes (Signed)
Pharmacy Antibiotic Note  Alexander Greene is a 66 y.o. male admitted on 06/17/2022 with  wound infection  Scr is 0.8 (~bl unknown), WBC WNL, CK elevated, lactate 2.1, and afebrile. Pharmacy has been consulted for vancomycin dosing.  Plan: Vancomycin 1000 mg x1 dose Vancomycin 500mg  IV every 12 hours. (eAUC 404, Vd 0.72,  IBW>TBW) Monitor for signs of clinical improvement, WBC, fever curve  Height: 5\' 11"  (180.3 cm) Weight: 54.4 kg (119 lb 14.9 oz) IBW/kg (Calculated) : 75.3  Temp (24hrs), Avg:97.2 F (36.2 C), Min:95.9 F (35.5 C), Max:98.4 F (36.9 C)  Recent Labs  Lab 06/14/2022 1105 06/26/2022 1108 06/18/2022 1324  WBC  --  10.3  --   CREATININE  --  0.80  --   LATICACIDVEN 2.3*  --  2.1*    Estimated Creatinine Clearance: 70.8 mL/min (by C-G formula based on SCr of 0.8 mg/dL).    No Known Allergies  Antimicrobials this admission: 9/13 cefepime >>  9/13 vancomycin >>    Microbiology results: 9/13 BCx: pending  Thank you for allowing pharmacy to be a part of this patient's care.  10/13, PharmD. [facility] Acute Care PGY-1  06/06/2022 5:01 PM

## 2022-06-11 NOTE — ED Notes (Signed)
Patient had a 8 beat run of V-tach on the monitor. Repeat EKG obtained. Provider notified.

## 2022-06-11 NOTE — Progress Notes (Addendum)
ANTICOAGULATION CONSULT NOTE - Initial Consult  Pharmacy Consult for heparin Indication: pulmonary embolus  No Known Allergies  Patient Measurements: Height: 5\' 11"  (180.3 cm) Weight: 54.4 kg (119 lb 14.9 oz) IBW/kg (Calculated) : 75.3 Heparin Dosing Weight: 54.4kg  Vital Signs: Temp: 98.4 F (36.9 C) (09/13 1440) Temp Source: Rectal (09/13 1440) BP: 129/79 (09/13 1445) Pulse Rate: 93 (09/13 1445)  Labs: Recent Labs    06/24/2022 1108 06/07/2022 1139 06/07/2022 1312 06/08/2022 1517  HGB 15.8 16.3  --  14.3  HCT 52.8* 48.0  --  42.0  PLT 195  --   --   --   APTT 24  --   --   --   LABPROT 15.1  --   --   --   INR 1.2  --   --   --   CREATININE 0.80  --   --   --   CKTOTAL 2,897*  --   --   --   TROPONINIHS  --   --  596*  --     Estimated Creatinine Clearance: 70.8 mL/min (by C-G formula based on SCr of 0.8 mg/dL).   Medical History: No past medical history on file.  Medications:  (Not in a hospital admission)  Scheduled:   flumazenil  0.2 mg Intravenous Once    Assessment: 71 yoM who was brought in by EMS, found down at his home and had altered mental status. PMH is unknown at this time. CT showed multiple clots. Pharmacy is being consulted to dose heparin for pulmonary embolism. Hgb and plts stable. No PTA anticoagulants  Goal of Therapy:  Heparin level 0.3-0.7 units/ml Monitor platelets by anticoagulation protocol: Yes   Plan:  Give 4000 units bolus x 1 Start heparin infusion at 950 units/hr Check anti-Xa level in 6 hours and daily while on heparin Continue to monitor H&H and platelets  77, PharmD. [facility] Acute Care PGY-1  06/25/2022 3:40 PM

## 2022-06-11 NOTE — ED Notes (Signed)
Critical lab results given to EDP 

## 2022-06-11 NOTE — ED Notes (Signed)
Patient transported to CT 

## 2022-06-11 NOTE — Consult Note (Deleted)
NAME:  Alexander Greene, MRN:  [id number], DOB:  1956-03-02, LOS: 0 ADMISSION DATE:  06/23/2022 CONSULTATION DATE:  06/14/2022 REFERRING MD:  Vernia Buff - EDP CHIEF COMPLAINT:  AMS, severe dehydration   History of Present Illness:  66 year old man who presented to [facility] ED 9/13 after being found down at his home by a neighbor. Last seen two days PTA. No known past medical history.  On ED arrival, patient was mildly hypothermic with HR 91, BP 131/81, RR 21, SpO2 99% on RA. Appeared severely dehydrated and cachectic. Labs demonstrated CBC WNL, Na 164, Cl 126, glucose 327, renal function WNL. Mildly elevated AST/ALT and Tbili. Trop 596 > 690. VBG 7.464/44/101/32.2; UA glucose > 500, moderate Hgb, +ketones/protein. CK 2897. LA 2.3. Ethanol/UDS negative. COVID/Flu negative. CXR with mild linear R basilar opacity, likely atelectasis. CT Head/Cspine with NAICA, but motion-degraded exam. CT Chest/A/P demonstrated RLL PE, c/f small LV thombus associated with papillary muscle, small splenic/renal infarcts, R common iliac artery occlusion, L common femoral artery occlusion. VVS was consulted for evaluation. Heparin started for multiple thromboembolic issues. Patient was given Ativan for CT scans and became somnolent requiring flumazenil administration. D5 was initiated for hypernatremia (transitioned to LR). Blood cultures pending, broad spectrum vanc/cefepime initiated.   PCCM consulted for admission.  Pertinent Medical History:  No past medical history on file.  Significant Hospital Events: Including procedures, antibiotic start and stop dates in addition to other pertinent events   9/13 - Found down at home by neighbor, last seen 2 days PTA. To [facility] ED for evaluation. HyperNa, hyperglycemic, elevated CK, elevated AST/ALT; UDS negative. Multiple thomboembolic issues noted on imaging (as above). VVS consulted for ischemic LLE.  Interim History / Subjective:  PCCM consulted for admission.  Objective:  Blood  pressure 129/79, pulse 94, temperature 98.4 F (36.9 C), temperature source Rectal, resp. rate 13, height 5\' 11"  (1.803 m), weight 54.4 kg, SpO2 100 %.        Intake/Output Summary (Last 24 hours) at 06/10/2022 1801 Last data filed at 06/10/2022 1720 Gross per 24 hour  Intake 380.54 ml  Output --  Net 380.54 ml   Filed Weights   06/06/2022 1055 06/28/2022 1523  Weight: 68 kg 54.4 kg   Physical Examination: General: Acute-on-chronically ill-appearing middle aged man in NAD. Cachectic, appears older than stated age. HEENT: [place]/AT, anicteric sclera, PERRL, severely dry mucous membranes. Neuro: Lethargic. Responds to verbal stimuli. Following commands intermittently. Moves all 4 extremities spontaneously. Profound generalized weakness. CV: RRR, no m/g/r. PULM: Breathing even and unlabored on RA. Lung fields CTAB, diminished at bilateral bases. GI: Soft, nontender, nondistended. Normoactive bowel sounds. Extremities: No LE edema noted. LLE with eschar, findings of ischemia; no palpable pulses of LLE. Unable to doppler DP/PT pulses on L. Skin: Warm/dry, no rashes. LLE wound as above.  Resolved Hospital Problem List:    Assessment & Plan:  Acute metabolic encephalopathy Severe dehydration Elevated CK - Admit to ICU for close monitoring - Aggressive fluid rehydration with LR - Monitor Na as below - F/u repeat CT Head  - Goal MAP > 65 - Trend LA - F/u Cx data - Continue broad spectrum antibiotics until Cx negative  Hypernatremia - D5 discontinued in the setting of hyperNa and risk of rapid Na correction - Transitioned to LR - Trend BMP, Na Q2H - Replete electrolytes as indicated - Monitor I&Os - F/u urine studies  RLL PE Possible LV thrombus CT Chest/A/P demonstrating RLL PE. 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 - Heparin gtt as above - VVS consulted, appreciate recs - Likely will require L AKA when able to tolerate from a surgical standpoint  Hyperglycemia - SSI - CBGs Q4H - Goal CBG 140-180 - May require insulin gtt if serum glucose remains severely elevated after rehydration/luid resuscitation  Malnutrition At risk for refeeding Physical deconditioning Cachexia noted on presentation. - Nutrition consult, would likely benefit from enteral supplementation - PT/OT/SLP as clinically appropriate  Best Practice: (right click and "Reselect all SmartList Selections" daily)   Diet/type: NPO, ADAT as mental status improves DVT prophylaxis: systemic heparin GI prophylaxis: N/A Lines: N/A Foley:  N/A Code Status:  full code Last date of multidisciplinary goals of care discussion [Pending]  Labs:  CBC: Recent Labs  Lab Jun 15, 2022 1108 06/06/2022 1139 06/28/2022 1517 06/14/2022 1548  WBC 10.3  --   --   --   NEUTROABS 9.0*  --   --   --   HGB 15.8 16.3 14.3  --   HCT 52.8* 48.0 42.0  --   MCV 101.1*  --   --   --   PLT 195  --   --  186   Basic Metabolic Panel: Recent Labs  Lab 06/27/2022 1108 06/20/2022 1139 06/26/2022 1152 06/25/2022 1323 06/22/2022 1517  NA 164* 165*  --  163* 161*  K 3.6 3.3*  --   --  6.7*  CL 126*  --   --   --   --   CO2 26  --   --   --   --   GLUCOSE 327*  --   --   --   --   BUN 14  --   --   --   --   CREATININE 0.80  --   --   --   --   CALCIUM 8.5*  --   --   --   --   MG  --   --  2.0  --   --    GFR: Estimated Creatinine Clearance: 70.8 mL/min (by C-G formula based on SCr of 0.8 mg/dL). Recent Labs  Lab Jun 15, 2022 1105 06/20/2022 1108 June 15, 2022 1324  WBC  --  10.3  --   LATICACIDVEN 2.3*  --  2.1*   Liver Function Tests: Recent Labs  Lab 06/04/2022 1108  AST 77*  ALT 87*  ALKPHOS 124   BILITOT 1.9*  PROT 6.9  ALBUMIN 2.4*   No results for input(s): "LIPASE", "AMYLASE" in the last 168 hours. No results for input(s): "AMMONIA" in the last 168 hours.  ABG:    Component Value Date/Time   HCO3 32.3 (H) June 15, 2022 1517   TCO2 34 (H) 06/28/2022 1517   O2SAT 98 06/02/2022 1517    Coagulation Profile: Recent Labs  Lab 15-Jun-2022 1108 June 15, 2022 1548  INR 1.2 1.3*   Cardiac Enzymes: Recent Labs  Lab 06/18/2022 1108  CKTOTAL 2,897*   HbA1C: No results found for: "HGBA1C"  CBG: Recent Labs  Lab Jun 15, 2022 1207  GLUCAP 295*   Review of Systems:   Patient is encephalopathic and/or intubated. Therefore history has been obtained from chart review.   Past Medical History:  He,  has no past medical history on file.  Surgical History:  No past surgical history on file.   Social History:      Family History:  His family history is not on file.   Allergies: No Known Allergies   Home Medications: Prior to Admission medications   Medication Sig Start Date End Date Taking? Authorizing Provider  ibuprofen (ADVIL,MOTRIN) 600 MG tablet Take 1 tablet (600 mg total) by mouth every 6 (six) hours as needed. Patient not taking: Reported on 04/05/2021 05/30/17   Shary Decamp, PA-C  methocarbamol (ROBAXIN) 500 MG tablet Take 1 tablet (500 mg total) by mouth 2 (two) times daily. 04/05/21   Volanda Napoleon, PA-C  naproxen (NAPROSYN) 500 MG tablet Take 1 tablet (500 mg total) by mouth 2 (two) times daily with a meal. Patient not taking: Reported on 04/05/2021 10/27/16   Emeline General, PA-C    Critical care time:    The patient is critically ill with multiple organ system failure and requires high complexity decision making for assessment and support, frequent evaluation and titration of therapies, advanced monitoring, review of radiographic studies and interpretation of complex data.   Critical Care Time devoted to patient care services, exclusive of separately billable  procedures, described in this note is 49 minutes.  Lestine Mount, PA-C [facility] 06/09/2022 6:01 PM  Please see [email or web address] for pager details.  From 7A-7P if no response, please call [phone] After hours, please call ELink [phone]

## 2022-06-11 NOTE — H&P (Addendum)
NAME:  Alexander Greene, MRN:  [id number], DOB:  1956/05/02, LOS: 0 ADMISSION DATE:  10-Jul-2022 CONSULTATION DATE:  2022/07/10 REFERRING MD:  Vernia Buff - EDP CHIEF COMPLAINT:  AMS, severe dehydration   History of Present Illness:  66 year old man who presented to [facility] ED 9/13 after being found down at his home by a neighbor. Last seen two days PTA. No known past medical history.  On ED arrival, patient was mildly hypothermic with HR 91, BP 131/81, RR 21, SpO2 99% on RA. Appeared severely dehydrated and cachectic. Labs demonstrated CBC WNL, Na 164, Cl 126, glucose 327, renal function WNL. Mildly elevated AST/ALT and Tbili. Trop 596 > 690. VBG 7.464/44/101/32.2; UA glucose > 500, moderate Hgb, +ketones/protein. CK 2897. LA 2.3. Ethanol/UDS negative. COVID/Flu negative. CXR with mild linear R basilar opacity, likely atelectasis. CT Head/Cspine with NAICA, but motion-degraded exam. CT Chest/A/P demonstrated RLL PE, c/f small LV thombus associated with papillary muscle, small splenic/renal infarcts, R common iliac artery occlusion, L common femoral artery occlusion. VVS was consulted for evaluation. Heparin started for multiple thromboembolic issues. Patient was given Ativan for CT scans and became somnolent requiring flumazenil administration. D5 was initiated for hypernatremia (transitioned to LR). Blood cultures pending, broad spectrum vanc/cefepime initiated.   PCCM consulted for admission.  Pertinent Medical History:  No past medical history on file.  Significant Hospital Events: Including procedures, antibiotic start and stop dates in addition to other pertinent events   9/13 - Found down at home by neighbor, last seen 2 days PTA. To [facility] ED for evaluation. HyperNa, hyperglycemic, elevated CK, elevated AST/ALT; UDS negative. Multiple thomboembolic issues noted on imaging (as above). VVS consulted for ischemic LLE.  Interim History / Subjective:  PCCM consulted for admission.  Objective:  Blood  pressure 129/79, pulse 94, temperature 98.4 F (36.9 C), temperature source Rectal, resp. rate 13, height 5\' 11"  (1.803 m), weight 54.4 kg, SpO2 100 %.        Intake/Output Summary (Last 24 hours) at 07-10-2022 1802 Last data filed at 2022-07-10 1720 Gross per 24 hour  Intake 380.54 ml  Output --  Net 380.54 ml    Filed Weights   10-Jul-2022 1055 07-10-2022 1523  Weight: 68 kg 54.4 kg   Physical Examination: General: Acute-on-chronically ill-appearing middle aged man in NAD. Cachectic, appears older than stated age. HEENT: [place]/AT, anicteric sclera, PERRL, severely dry mucous membranes. Neuro: Lethargic. Responds to verbal stimuli. Following commands intermittently. Moves all 4 extremities spontaneously. Profound generalized weakness. CV: RRR, no m/g/r. PULM: Breathing even and unlabored on RA. Lung fields CTAB, diminished at bilateral bases. GI: Soft, nontender, nondistended. Normoactive bowel sounds. Extremities: No LE edema noted. LLE with eschar, findings of ischemia; no palpable pulses of LLE. Unable to doppler DP/PT pulses on L. Skin: Warm/dry, no rashes. LLE wound as above.  Resolved Hospital Problem List:    Assessment & Plan:  Acute metabolic encephalopathy Severe dehydration Elevated CK - Admit to ICU for close monitoring - Aggressive fluid rehydration with LR - Monitor Na as below - F/u repeat CT Head  - Goal MAP > 65 - Trend LA - F/u Cx data - Continue broad spectrum antibiotics until Cx negative  Hypernatremia - D5 discontinued in the setting of hyperNa and risk of rapid Na correction - Transitioned to LR - Trend BMP, Na Q2H - Replete electrolytes as indicated - Monitor I&Os - F/u urine studies  RLL PE Possible LV thrombus CT Chest/A/P demonstrating RLL PE. 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 - Heparin gtt as above - VVS consulted, appreciate recs - Likely will require L AKA when able to tolerate from a surgical standpoint - Per VVS, will require R iliofemoral embolectomy   Hyperglycemia - SSI - CBGs Q4H - Goal CBG 140-180 - May require insulin gtt if serum glucose remains severely elevated after rehydration/luid resuscitation  Malnutrition At risk for refeeding Physical deconditioning Cachexia noted on presentation. - Nutrition consult, would likely benefit from enteral supplementation - PT/OT/SLP as clinically appropriate  Best Practice: (right click and "Reselect all SmartList Selections" daily)   Diet/type: NPO, ADAT as mental status improves DVT prophylaxis: systemic heparin GI prophylaxis: N/A Lines: N/A Foley:  N/A Code Status:  full code Last date of multidisciplinary goals of care discussion [Pending]  Labs:  CBC: Recent Labs  Lab 06/24/2022 1108 06/26/2022 1139 06/04/2022 1517 06/05/2022 1548  WBC 10.3  --   --   --   NEUTROABS 9.0*  --   --   --   HGB 15.8 16.3 14.3  --   HCT 52.8* 48.0 42.0  --   MCV 101.1*  --   --   --   PLT 195  --   --  [id number]    Basic Metabolic Panel: Recent Labs  Lab 06/12/2022 1108 06/21/2022 1139 06/10/2022 1152 06/02/2022 1323 06/27/2022 1517  NA 164* 165*  --  163* 161*  K 3.6 3.3*  --   --  6.7*  CL 126*  --   --   --   --   CO2 26  --   --   --   --   GLUCOSE 327*  --   --   --   --   BUN 14  --   --   --   --   CREATININE 0.80  --   --   --   --   CALCIUM 8.5*  --   --   --   --   MG  --   --  2.0  --   --     GFR: Estimated Creatinine Clearance: 70.8 mL/min (by C-G formula based on SCr of 0.8 mg/dL). Recent Labs  Lab 06/28/2022 1105 06/28/2022 1108 06/10/2022 1324  WBC  --  10.3  --   LATICACIDVEN 2.3*  --  2.1*    Liver Function  Tests: Recent Labs  Lab 06/02/2022 1108  AST 77*  ALT 87*  ALKPHOS 124  BILITOT 1.9*  PROT 6.9  ALBUMIN 2.4*    No results for input(s): "LIPASE", "AMYLASE" in the last 168 hours. No results for input(s): "AMMONIA" in the last 168 hours.  ABG:    Component Value Date/Time   HCO3 32.3 (H) 06/13/2022 1517   TCO2 34 (H) 06/22/2022 1517   O2SAT 98 06/22/2022 1517    Coagulation Profile: Recent Labs  Lab 06/26/2022 1108 05/31/2022 1548  INR 1.2 1.3*    Cardiac Enzymes: Recent Labs  Lab 06/14/2022 1108  CKTOTAL 2,897*    HbA1C: No results found for: "HGBA1C"  CBG: Recent Labs  Lab 06/10/2022 [place]*    Review of Systems:   Patient is encephalopathic and/or intubated. Therefore history has  been obtained from chart review.   Past Medical History:  He,  has no past medical history on file.   Surgical History:  No past surgical history on file.   Social History:      Family History:  His family history is not on file.   Allergies: No Known Allergies   Home Medications: Prior to Admission medications   Medication Sig Start Date End Date Taking? Authorizing Provider  ibuprofen (ADVIL,MOTRIN) 600 MG tablet Take 1 tablet (600 mg total) by mouth every 6 (six) hours as needed. Patient not taking: Reported on 04/05/2021 05/30/17   Audry Pili, PA-C  methocarbamol (ROBAXIN) 500 MG tablet Take 1 tablet (500 mg total) by mouth 2 (two) times daily. 04/05/21   Maxwell Caul, PA-C  naproxen (NAPROSYN) 500 MG tablet Take 1 tablet (500 mg total) by mouth 2 (two) times daily with a meal. Patient not taking: Reported on 04/05/2021 10/27/16   Georgiana Shore, PA-C    Critical care time:    The patient is critically ill with multiple organ system failure and requires high complexity decision making for assessment and support, frequent evaluation and titration of therapies, advanced monitoring, review of radiographic studies and interpretation of complex data.    Critical Care Time devoted to patient care services, exclusive of separately billable procedures, described in this note is 49 minutes.  Tim Lair, PA-C [facility] 06/18/2022 6:02 PM  Please see [email or web address] for pager details.  From 7A-7P if no response, please call [phone] After hours, please call ELink [phone]
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
## 2022-06-12 ENCOUNTER — Inpatient Hospital Stay (HOSPITAL_COMMUNITY): Payer: Self-pay

## 2022-06-12 DIAGNOSIS — Z789 Other specified health status: Secondary | ICD-10-CM

## 2022-06-12 DIAGNOSIS — T796XXA Traumatic ischemia of muscle, initial encounter: Secondary | ICD-10-CM

## 2022-06-12 DIAGNOSIS — I70209 Unspecified atherosclerosis of native arteries of extremities, unspecified extremity: Secondary | ICD-10-CM | POA: Diagnosis present

## 2022-06-12 DIAGNOSIS — I5041 Acute combined systolic (congestive) and diastolic (congestive) heart failure: Secondary | ICD-10-CM

## 2022-06-12 DIAGNOSIS — E43 Unspecified severe protein-calorie malnutrition: Secondary | ICD-10-CM | POA: Insufficient documentation

## 2022-06-12 DIAGNOSIS — B9561 Methicillin susceptible Staphylococcus aureus infection as the cause of diseases classified elsewhere: Secondary | ICD-10-CM

## 2022-06-12 DIAGNOSIS — Z515 Encounter for palliative care: Secondary | ICD-10-CM

## 2022-06-12 DIAGNOSIS — R7881 Bacteremia: Secondary | ICD-10-CM

## 2022-06-12 DIAGNOSIS — I998 Other disorder of circulatory system: Secondary | ICD-10-CM

## 2022-06-12 DIAGNOSIS — D735 Infarction of spleen: Secondary | ICD-10-CM

## 2022-06-12 DIAGNOSIS — I513 Intracardiac thrombosis, not elsewhere classified: Secondary | ICD-10-CM | POA: Insufficient documentation

## 2022-06-12 DIAGNOSIS — E870 Hyperosmolality and hypernatremia: Secondary | ICD-10-CM

## 2022-06-12 DIAGNOSIS — I2699 Other pulmonary embolism without acute cor pulmonale: Secondary | ICD-10-CM

## 2022-06-12 DIAGNOSIS — R4182 Altered mental status, unspecified: Secondary | ICD-10-CM | POA: Insufficient documentation

## 2022-06-12 DIAGNOSIS — E860 Dehydration: Secondary | ICD-10-CM

## 2022-06-12 LAB — CBC
HCT: 45.6 % (ref 39.0–52.0)
Hemoglobin: 14 g/dL (ref 13.0–17.0)
MCH: 29.9 pg (ref 26.0–34.0)
MCHC: 30.7 g/dL (ref 30.0–36.0)
MCV: 97.2 fL (ref 80.0–100.0)
Platelets: 176 10*3/uL (ref 150–400)
RBC: 4.69 MIL/uL (ref 4.22–5.81)
RDW: 13.7 % (ref 11.5–15.5)
WBC: 11.7 10*3/uL — ABNORMAL HIGH (ref 4.0–10.5)
nRBC: 0 % (ref 0.0–0.2)

## 2022-06-12 LAB — BLOOD CULTURE ID PANEL (REFLEXED) - BCID2

## 2022-06-12 LAB — BASIC METABOLIC PANEL
Anion gap: 6 (ref 5–15)
Anion gap: 10 (ref 5–15)
BUN: 7 mg/dL — ABNORMAL LOW (ref 8–23)
BUN: 8 mg/dL (ref 8–23)
CO2: 30 mmol/L (ref 22–32)
CO2: 32 mmol/L (ref 22–32)
Calcium: 8 mg/dL — ABNORMAL LOW (ref 8.9–10.3)
Calcium: 8.4 mg/dL — ABNORMAL LOW (ref 8.9–10.3)
Chloride: 126 mmol/L — ABNORMAL HIGH (ref 98–111)
Chloride: 121 mmol/L — ABNORMAL HIGH (ref 98–111)
Creatinine, Ser: 0.86 mg/dL (ref 0.61–1.24)
Creatinine, Ser: 0.74 mg/dL (ref 0.61–1.24)
GFR, Estimated: >60 mL/min (ref >60)
GFR, Estimated: >60 mL/min (ref >60)
Glucose, Bld: 195 mg/dL — ABNORMAL HIGH (ref 70–99)
Glucose, Bld: 87 mg/dL (ref 70–99)
Potassium: 2.6 mmol/L — CL (ref 3.5–5.1)
Potassium: 3.2 mmol/L — ABNORMAL LOW (ref 3.5–5.1)
Sodium: 162 mmol/L (ref 135–145)
Sodium: 163 mmol/L (ref 135–145)

## 2022-06-12 LAB — ECHOCARDIOGRAM COMPLETE
AR max vel: 2.06 cm2
AV Area VTI: 1.84 cm2
AV Area mean vel: 2.02 cm2
AV Mean grad: 4 mmHg
AV Peak grad: 7.3 mmHg
Ao pk vel: 1.35 m/s
Area-P 1/2: 5.34 cm2
Calc EF: 20.7 %
Height: 71 in
S' Lateral: 5 cm
Single Plane A2C EF: 17.2 %
Single Plane A4C EF: 28 %
Weight: 1809.54 oz

## 2022-06-12 LAB — GLUCOSE, CAPILLARY
Glucose-Capillary: 145 mg/dL — ABNORMAL HIGH (ref 70–99)
Glucose-Capillary: 179 mg/dL — ABNORMAL HIGH (ref 70–99)
Glucose-Capillary: 181 mg/dL — ABNORMAL HIGH (ref 70–99)
Glucose-Capillary: 184 mg/dL — ABNORMAL HIGH (ref 70–99)
Glucose-Capillary: 187 mg/dL — ABNORMAL HIGH (ref 70–99)
Glucose-Capillary: 196 mg/dL — ABNORMAL HIGH (ref 70–99)
Glucose-Capillary: 80 mg/dL (ref 70–99)

## 2022-06-12 LAB — HEMOGLOBIN A1C
Hgb A1c MFr Bld: 12.6 % — ABNORMAL HIGH (ref 4.8–5.6)
Mean Plasma Glucose: 314.92 mg/dL

## 2022-06-12 LAB — HIV ANTIBODY (ROUTINE TESTING W REFLEX): HIV Screen 4th Generation wRfx: NONREACTIVE

## 2022-06-12 LAB — MAGNESIUM
Magnesium: 1.7 mg/dL (ref 1.7–2.4)
Magnesium: 2.4 mg/dL (ref 1.7–2.4)
Magnesium: 1.9 mg/dL (ref 1.7–2.4)

## 2022-06-12 LAB — HEPARIN LEVEL (UNFRACTIONATED): Heparin Unfractionated: 0.39 IU/mL (ref 0.30–0.70)

## 2022-06-12 LAB — SODIUM
Sodium: 163 mmol/L (ref 135–145)
Sodium: 159 mmol/L — ABNORMAL HIGH (ref 135–145)
Sodium: 157 mmol/L — ABNORMAL HIGH (ref 135–145)
Sodium: 156 mmol/L — ABNORMAL HIGH (ref 135–145)
Sodium: 158 mmol/L — ABNORMAL HIGH (ref 135–145)
Sodium: 157 mmol/L — ABNORMAL HIGH (ref 135–145)
Sodium: 157 mmol/L — ABNORMAL HIGH (ref 135–145)
Sodium: 153 mmol/L — ABNORMAL HIGH (ref 135–145)
Sodium: 155 mmol/L — ABNORMAL HIGH (ref 135–145)

## 2022-06-12 LAB — SURGICAL PCR SCREEN
MRSA, PCR: NEGATIVE
Staphylococcus aureus: NEGATIVE

## 2022-06-12 LAB — CK: Total CK: 2480 U/L — ABNORMAL HIGH (ref 49–397)

## 2022-06-12 LAB — C-REACTIVE PROTEIN: CRP: 26.6 mg/dL — ABNORMAL HIGH (ref <1.0)

## 2022-06-12 LAB — PHOSPHORUS: Phosphorus: 1 mg/dL — CL (ref 2.5–4.6)

## 2022-06-12 LAB — TROPONIN I (HIGH SENSITIVITY): Troponin I (High Sensitivity): 1345 ng/L (ref <18)

## 2022-06-12 LAB — VITAMIN B12: Vitamin B-12: 2569 pg/mL — ABNORMAL HIGH (ref 180–914)

## 2022-06-12 MED ORDER — ORAL CARE MOUTH RINSE: 15.0000 mL | OROMUCOSAL | Status: DC | PRN

## 2022-06-12 MED ORDER — POTASSIUM PHOSPHATES 15 MMOLE/5ML IV SOLN
15.0000 mmol | Freq: Once | INTRAVENOUS | Status: AC
  Filled 2022-06-12: qty 5

## 2022-06-12 MED ORDER — PERFLUTREN LIPID MICROSPHERE
1.0000 mL | INTRAVENOUS | Status: AC | PRN
  Administered 2022-06-12: 1 mL via INTRAVENOUS

## 2022-06-12 MED ORDER — PANTOPRAZOLE SODIUM 40 MG PO TBEC
40.0000 mg | DELAYED_RELEASE_TABLET | Freq: Every day | ORAL | Status: DC
  Filled 2022-06-12: qty 1

## 2022-06-12 MED ORDER — HYDROMORPHONE HCL 1 MG/ML IJ SOLN
0.2500 mg | INTRAMUSCULAR | Status: DC | PRN
  Administered 2022-06-12 – 2022-06-17 (×6): 0.25 mg via INTRAVENOUS
  Filled 2022-06-12: qty 0.5
  Filled 2022-06-12 (×2): qty 1
  Filled 2022-06-12: qty 0.5
  Filled 2022-06-12: qty 1
  Filled 2022-06-12: qty 0.5

## 2022-06-12 MED ORDER — VITAL AF 1.2 CAL PO LIQD
1000.0000 mL | ORAL | Status: DC
  Administered 2022-06-12 – 2022-06-17 (×6): 1000 mL
  Filled 2022-06-12 (×2): qty 1000

## 2022-06-12 MED ORDER — FREE WATER
200.0000 mL | Freq: Four times a day (QID) | Status: DC
  Administered 2022-06-12: 200 mL

## 2022-06-12 MED ORDER — THIAMINE MONONITRATE 100 MG PO TABS
100.0000 mg | ORAL_TABLET | Freq: Every day | ORAL | Status: DC
  Administered 2022-06-12 – 2022-06-17 (×6): 100 mg
  Filled 2022-06-12 (×6): qty 1

## 2022-06-12 MED ORDER — PANTOPRAZOLE 2 MG/ML SUSPENSION
40.0000 mg | Freq: Every day | ORAL | Status: DC
  Administered 2022-06-12 – 2022-06-17 (×6): 40 mg
  Filled 2022-06-12 (×6): qty 20

## 2022-06-12 MED ORDER — CEFAZOLIN SODIUM-DEXTROSE 2-4 GM/100ML-% IV SOLN
2.0000 g | Freq: Three times a day (TID) | INTRAVENOUS | Status: DC
  Administered 2022-06-12 – 2022-06-17 (×16): 2 g via INTRAVENOUS
  Filled 2022-06-12 (×18): qty 100

## 2022-06-12 MED ORDER — DEXTROSE 5 % IV SOLN: INTRAVENOUS | Status: DC

## 2022-06-12 MED ORDER — POTASSIUM CHLORIDE 10 MEQ/100ML IV SOLN
10.0000 meq | INTRAVENOUS | Status: DC
  Administered 2022-06-12 (×5): 10 meq via INTRAVENOUS
  Filled 2022-06-12 (×5): qty 100

## 2022-06-12 MED ORDER — HEPARIN (PORCINE) 25000 UT/250ML-% IV SOLN
1100.0000 [IU]/h | INTRAVENOUS | Status: DC
  Administered 2022-06-12: 1000 [IU]/h via INTRAVENOUS
  Administered 2022-06-13: 1050 [IU]/h via INTRAVENOUS
  Administered 2022-06-14 – 2022-06-17 (×4): 1100 [IU]/h via INTRAVENOUS
  Filled 2022-06-12 (×7): qty 250

## 2022-06-12 MED ORDER — FREE WATER
200.0000 mL | Freq: Four times a day (QID) | Status: DC
  Administered 2022-06-12 – 2022-06-17 (×20): 200 mL

## 2022-06-12 MED ORDER — ACETAMINOPHEN 160 MG/5ML PO SOLN
650.0000 mg | Freq: Four times a day (QID) | ORAL | Status: DC | PRN
  Administered 2022-06-12: 650 mg via ORAL
  Filled 2022-06-12 (×2): qty 20.3

## 2022-06-12 MED ORDER — POTASSIUM PHOSPHATES 15 MMOLE/5ML IV SOLN
30.0000 mmol | Freq: Once | INTRAVENOUS | Status: AC
  Administered 2022-06-12: 30 mmol via INTRAVENOUS
  Filled 2022-06-12: qty 10

## 2022-06-12 MED ORDER — MAGNESIUM SULFATE 2 GM/50ML IV SOLN
2.0000 g | Freq: Once | INTRAVENOUS | Status: AC
  Administered 2022-06-12: 2 g via INTRAVENOUS
  Filled 2022-06-12: qty 50

## 2022-06-12 MED ORDER — ORAL CARE MOUTH RINSE
15.0000 mL | OROMUCOSAL | Status: DC
  Administered 2022-06-12 – 2022-06-18 (×25): 15 mL via OROMUCOSAL

## 2022-06-12 MED ORDER — ADULT MULTIVITAMIN W/MINERALS CH
1.0000 | ORAL_TABLET | Freq: Every day | ORAL | Status: DC
  Administered 2022-06-12 – 2022-06-17 (×6): 1
  Filled 2022-06-12 (×6): qty 1

## 2022-06-12 NOTE — Progress Notes (Addendum)
eLink Physician-Brief Progress Note Patient Name: Alexander Greene DOB: 1956/02/04 MRN: [id number]   Date of Service  06/12/2022  HPI/Events of Note  Na at 164 <-- 161 <-- 165. RN also reporting ectopy.   eICU Interventions  Change IVFs to D5W@50cc /hr.  BMP is still pending.      Intervention Category Intermediate Interventions: Electrolyte abnormality - evaluation and management  Larinda Buttery 06/12/2022, 12:26 AM  2:47 AM Na 162 <-- 164 on D5W@50cc /hr.  K 2.6, crea 9.86, Mg 1.7.   Plan> Continue D5W.  Replete K and Mg.

## 2022-06-12 NOTE — Progress Notes (Signed)
eLink Physician-Brief Progress Note Patient Name: Alexander Greene DOB: [birth year] MRN: [id number]   Date of Service  06/12/2022  HPI/Events of Note  Notified by Rph of positive blood culture Methicillin sensitive staph epidermidis.   eICU Interventions  Switch to Cefazolin.      Intervention Category Intermediate Interventions: Infection - evaluation and management  Larinda Buttery 06/12/2022, 5:15 AM

## 2022-06-12 NOTE — Progress Notes (Addendum)
PCCM interval progress note:  NA level Sodium 163 >161 > 163> 161> 164> 162> 163 > 163> 159 > 157 > 156. Has decreased in past 24 hours. Goal decrease 8-10 ideally.  Patient neuro status improving was GCS 12, now GCS 14. FC commands now, confused.  -D5W stopped before noon on 9/14. Free H20 given at noon. Holding on further free H20 administration on 9/14. Resume on 9/15. -Continue to trend NA q2h. If stable can transition to q4h. -Continue monitoring neuro exam.  [name]., MSN, APRN, AGACNP-BC [facility]  06/12/2022 , 2:33 PM  Please see [email or web address] for pager details  If no response, please call [phone] After hours, please call Elink at [phone]  Addendum Notified of EF of 20-25% on ECHO. -Cardiology consulted.  Addendum 159 > 157 > 156 > 158 > 157  -Free H20 resumed -Continue NA check q2h

## 2022-06-12 NOTE — Progress Notes (Signed)
eLink Physician-Brief Progress Note Patient Name: Alexander Greene DOB: May 11, 1956 MRN: [id number]   Date of Service  06/12/2022  HPI/Events of Note  Na 163, K 3.2, crea 0.74.  Phos 1.0.  CK trending down from 2897 --> 2480    eICU Interventions  Pt given KCL x 5 earlier.  Hold the rest of the KCL orders.  Give Kphos.  Increased D5W to 75cc/hr.      Intervention Category Intermediate Interventions: Electrolyte abnormality - evaluation and management  Larinda Buttery 06/12/2022, 6:39 AM

## 2022-06-12 NOTE — Progress Notes (Signed)
NAME:  KENNER LEWAN, MRN:  [id number], DOB:  1955/11/13, LOS: 1 ADMISSION DATE:  06/06/2022 CONSULTATION DATE:  06/04/2022 REFERRING MD:  Vernia Buff - EDP CHIEF COMPLAINT:  AMS, severe dehydration   History of Present Illness:  66 year old man who presented to [facility] ED 9/13 after being found down at his home by a neighbor. Last seen two days PTA. No known past medical history.  On ED arrival, patient was mildly hypothermic with HR 91, BP 131/81, RR 21, SpO2 99% on RA. Appeared severely dehydrated and cachectic. Labs demonstrated CBC WNL, Na 164, Cl 126, glucose 327, renal function WNL. Mildly elevated AST/ALT and Tbili. Trop 596 > 690. VBG 7.464/44/101/32.2; UA glucose > 500, moderate Hgb, +ketones/protein. CK 2897. LA 2.3. Ethanol/UDS negative. COVID/Flu negative. CXR with mild linear R basilar opacity, likely atelectasis. CT Head/Cspine with NAICA, but motion-degraded exam. CT Chest/A/P demonstrated RLL PE, c/f small LV thombus associated with papillary muscle, small splenic/renal infarcts, R common iliac artery occlusion, L common femoral artery occlusion. VVS was consulted for evaluation. Heparin started for multiple thromboembolic issues. Patient was given Ativan for CT scans and became somnolent requiring flumazenil administration. D5 was initiated for hypernatremia (transitioned to LR). Blood cultures pending, broad spectrum vanc/cefepime initiated.   PCCM consulted for admission.  Pertinent Medical History:  No past medical history on file.  Significant Hospital Events: Including procedures, antibiotic start and stop dates in addition to other pertinent events   9/13 - Found down at home by neighbor, last seen 2 days PTA. To [facility] ED for evaluation. HyperNa, hyperglycemic, elevated CK, elevated AST/ALT; UDS negative. Multiple thomboembolic issues noted on imaging (as above). VVS consulted for ischemic LLE. 9/14 VANC/CEFE> Ancef, BC>MSSA  Interim History / Subjective:  PCCM consulted for  admission.  Objective:  Blood pressure 112/73, pulse 69, temperature 99 F (37.2 C), temperature source Axillary, resp. rate 19, height 5\' 11"  (1.803 m), weight 51.3 kg, SpO2 95 %.        Intake/Output Summary (Last 24 hours) at 06/12/2022 0819 Last data filed at 06/12/2022 0700 Gross per 24 hour  Intake 1613.24 ml  Output 840 ml  Net 773.24 ml   Filed Weights   06/15/2022 1055 06/18/2022 1523 06/12/22 0400  Weight: 68 kg 54.4 kg 51.3 kg   Physical Examination: General: In bed, NAD, appears comfortable, thin, poor dental hygine HEENT: MM pink/moist, anicteric, atraumatic Neuro: RASS 0, PERRL 45mm, GCS 12, localizes to pain, inappropriate, eyes open spont, no movement in BLLE CV: S1S2, NSR, frequent PVC, no m/r/g appreciated PULM:  clear in the upper lobes, clear in the lower lobes, trachea midline, chest expansion symmetric GI: soft, bsx4 active, non-tender   Extremities: Lower extremities cool, no dopplerable DP or PT, no pretibial edema, delayed cap refill BLLE  Skin: L LLE wounds, no rashes or lesions noted    Labs/Imaging CK 2.8K>2.4K Sodium 163 > 161 > 1 63 > 164 > 162 > 163 Potassium 6.7 > 2.9 > 2.6 > 3.2 Chloride 123 > 126 > 121 > Glucose 187-304 WBC 10.3 > 11.7 Fibrinogen 714 D-dimer 11.34 INR 1.3 Troponin 596 > 690 >1345 UDS negative UA some ketones, few bacteria, glucose greater than 500 Lactate 2.3 > 2.1 COVID flu negative Ethanol negative CT head no acute intracranial abnormality per radiology CTA chest: Right lower lobe pulmonary embolus and basilar segmental level branches,?  Thrombus left ventricle, dilated left ventricle with associated and oriented infarcts in the spleen and left kidney, occlusion of right common iliac artery  through common femoral artery, focal occlusion of left common femoral artery with little contrast seen per radiology, 7 mm solid pulmonary nodule in the upper lobe Chest x-ray: No pneumothorax, no effusion, no obvious  infiltrate.  Resolved Hospital Problem List:    Assessment & Plan:  Acute metabolic encephalopathy in setting of hypernatremia and severe hydration Hypernatremia, suspect acute Started on D5W overnight at 75. Suspect this is acute hypernatremia. Head CT negative on admission.  -Continue in ICU -Continue D5W at 75. -Continue trending NA q2h. Goal decrease 8-10 in the next 24 hours. -Obtain enteral access, start free h20 Q6H -Strict I and O -monitor neuro exam -Goal MAP 65  MSSA bacteremia  [facility] 9/14 + for MSSA bacteremia. Suspect secondary to leg wounds? -Continue cefazolin -Follow up ECHO  Troponin elevation suspect secondary to demand Troponin 596 > 690 >1345 -continue heparin GTT -Trend troponin -Continue tele  Acute rhabdomyolysis RLL PE Possible LV thrombus with endo organ embolic complications including bilateral kidneys and spleen L common femoral artery occlusion R common iliac artery occlusion LL extremity leg wounds ? PFO. CT Chest/A/P demonstrating RLL PE and multiple areas of occlusion now with ischemic limb. -Heparin GTT as above -Appreciate VVS assistance. Feels that patient will need at minimum right lower extremity revascularization, fasciotomies, left above-knee amputation and is ultimatley looking at BL AKA. Recommend PC consult. Also concerned thrombus could be infected. -Follow up ECHO - Eventual transition to PO anticoagulation once nearing discharge -CK improving trend -Wound care per vascular  Hyperglycemia Glucose 187-304 -Blood Glucose goal 140-180. -SSI -check A1C  Malnutrition At risk for refeeding Physical deconditioning Cachexia noted on presentation. - RD consult - Start PT/OT/SLP when appropriate   Hyperkalemia>resolved Hypokalemia Potassium 6.7 > 2.9 > 2.6 > 3.2 -Replete -Follow up on repeat labs  7 mm solid nodule in the right upper lobe -Follow up chest CT in 6-12 months  Best Practice: (right click and "Reselect all  SmartList Selections" daily)   Diet/type: NPO, ADAT as mental status improves DVT prophylaxis: systemic heparin GI prophylaxis: PPI Lines: N/A Foley:  N/A Code Status:  full code Last date of multidisciplinary goals of care discussion [Unable to contact family. Alfredo Bach at [phone]. No answer, no message service]  Critical care time: 40 minutes  The patient is critically ill with multiple organ systems failure and requires high complexity decision making for assessment and support, frequent evaluation and titration of therapies, application of advanced monitoring technologies and extensive interpretation of multiple databases.    Critical Care Time devoted to patient care services described in this note is 40 minutes. This time reflects time of care of this signee Karle Barr NP. This critical care time does not reflect procedure time but could involve care discussion time with the PCCM attending.  [name]., MSN, APRN, AGACNP-BC [facility]  06/12/2022 , 8:19 AM  Please see [email or web address] for pager details  If no response, please call [phone] After hours, please call Elink at [phone]

## 2022-06-12 NOTE — Progress Notes (Signed)
ANTICOAGULATION CONSULT NOTE  Pharmacy Consult for heparin Indication: pulmonary embolus  No Known Allergies  Patient Measurements: Height: 5\' 11"  (180.3 cm) Weight: 51.3 kg (113 lb 1.5 oz) IBW/kg (Calculated) : 75.3 Heparin Dosing Weight: 54.4kg  Vital Signs: Temp: 98.5 F (36.9 C) (09/14 0800) Temp Source: Axillary (09/14 0800) BP: 112/73 (09/14 0800) Pulse Rate: 69 (09/14 0800)  Labs: Recent Labs    06/23/2022 1108 06/16/2022 1139 06/05/2022 1312 06/18/2022 1517 06/10/2022 1548 06/01/2022 1715 05/30/2022 2227 06/12/22 0013 06/12/22 0412 06/12/22 0628  HGB 15.8 16.3  --  14.3  --   --   --   --  14.0  --   HCT 52.8* 48.0  --  42.0  --   --   --   --  45.6  --   PLT 195  --   --   --  186  --   --   --  176  --   APTT 24  --   --   --  25  --   --   --   --   --   LABPROT 15.1  --   --   --  16.0*  --   --   --   --   --   INR 1.2  --   --   --  1.3*  --   --   --   --   --   HEPARINUNFRC  --   --   --   --   --   --  0.49  --   --  0.39  CREATININE 0.80  --   --   --   --  0.82  --  0.86 0.74  --   CKTOTAL 2,897*  --   --   --   --   --   --   --  2,480*  --   TROPONINIHS  --   --  596* 690*  --   --   --   --   --  1,345*     Estimated Creatinine Clearance: 66.8 mL/min (by C-G formula based on SCr of 0.74 mg/dL).   Medical History: No past medical history on file.  Medications:  Medications Prior to Admission  Medication Sig Dispense Refill Last Dose   methocarbamol (ROBAXIN) 500 MG tablet Take 1 tablet (500 mg total) by mouth 2 (two) times daily. 20 tablet 0    Scheduled:   Chlorhexidine Gluconate Cloth  6 each Topical q morning   free water  200 mL Per Tube Q6H   insulin aspart  2-6 Units Subcutaneous Q4H   mouth rinse  15 mL Mouth Rinse 4 times per day   pantoprazole  40 mg Oral Daily    Assessment: 12 yoM who was brought in by EMS, found down at his home and had altered mental status. PMH is unknown at this time. CT showed multiple clots. Pharmacy is being  consulted to dose heparin for pulmonary embolism. Hgb and plts stable. No PTA anticoagulants.  Heparin level of 0.39 is therapeutic on heparin 950 units/hr. Level drawn appropriately. CBC stable. No bleeding noted.   Goal of Therapy:  Heparin level 0.3-0.7 units/ml Monitor platelets by anticoagulation protocol: Yes   Plan:  Increase heparin to 1000 units/hr to ensure remains in therapeutic range - next heparin level tomorrow AM since therapeutic x2 Daily heparin level and CBC  76, PharmD, BCPS Clinical Pharmacist 06/12/2022 9:27 AM

## 2022-06-12 NOTE — Progress Notes (Addendum)
  Progress Note    06/12/2022 7:46 AM * No surgery found *  Subjective:  Patient is currently sleeping    Vitals:   06/12/22 0600 06/12/22 0700  BP: 100/75 105/76  Pulse: 87 86  Resp: 20 20  Temp:    SpO2: 97% 98%    Physical Exam: Lungs:  nonlabored Extremities:  wounds of LLE dressed. No doppler signals in bilateral feet Altered mental status, unable to participate, follow commands Mitts in place.    CBC    Component Value Date/Time   WBC 11.7 (H) 06/12/2022 0412   RBC 4.69 06/12/2022 0412   HGB 14.0 06/12/2022 0412   HGB 14.2 11/24/2012 1401   HCT 45.6 06/12/2022 0412   HCT 42.8 11/24/2012 1401   PLT 176 06/12/2022 0412   PLT 279 11/24/2012 1401   MCV 97.2 06/12/2022 0412   MCV 89 11/24/2012 1401   MCH 29.9 06/12/2022 0412   MCHC 30.7 06/12/2022 0412   RDW 13.7 06/12/2022 0412   RDW 14.9 (H) 11/24/2012 1401   LYMPHSABS 0.7 2022/06/27 1108   MONOABS 0.4 06-27-22 1108   EOSABS 0.0 06-27-22 1108   BASOSABS 0.0 06/27/22 1108    BMET    Component Value Date/Time   NA 163 (HH) 06/12/2022 0412   NA 142 11/24/2012 1401   K 3.2 (L) 06/12/2022 0412   K 3.7 11/24/2012 1401   CL 121 (H) 06/12/2022 0412   CL 107 11/24/2012 1401   CO2 32 06/12/2022 0412   CO2 28 11/24/2012 1401   GLUCOSE 87 06/12/2022 0412   GLUCOSE 131 (H) 11/24/2012 1401   BUN 8 06/12/2022 0412   BUN 16 11/24/2012 1401   CREATININE 0.74 06/12/2022 0412   CREATININE 1.02 11/24/2012 1401   CALCIUM 8.4 (L) 06/12/2022 0412   CALCIUM 9.3 11/24/2012 1401   GFRNONAA >60 06/12/2022 0412    INR    Component Value Date/Time   INR 1.3 (H) 2022-06-27 1548     Intake/Output Summary (Last 24 hours) at 06/12/2022 0746 Last data filed at 06/12/2022 0700 Gross per 24 hour  Intake 1613.24 ml  Output 840 ml  Net 773.24 ml     Assessment/Plan:  66 y.o. male with ischemic wounds bilaterally   -Patient is in need of L AKA due to extensive wounds and potentially R iliofemoral  embolectomy. Surgical intervention pending until patient is more medically stable. -Continue heparin gtt -CK trending down. Potassium up today to 3.2. Pending repeat troponin  Loel Dubonnet, PA-C [facility] [phone] 06/12/2022 7:46 AM   VASCULAR STAFF ADDENDUM: I have independently interviewed and examined the patient. Patient continues to have significant metabolic derangements No change in mental status No change in exam CK declining, likely due to fluid administration Patient with staph epi bacteremia, concerned that thrombus is infected Echo today, question if patient has endocarditis, Assume patient has PFO  Kori would benefit from palliative care consult due to his current clinical status. I have attempted to contact family to no avail. Please call me should any questions or concerns arise. I will continue to follow.  Should clinical status improve, patient will need at minimum right lower extremity revascularization, fasciotomies, left above-knee amputation.  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     Fara Olden, MD [facility] Phone Number: [phone] 06/12/2022 7:53 AM

## 2022-06-12 NOTE — Progress Notes (Signed)
PHARMACY - PHYSICIAN COMMUNICATION CRITICAL VALUE ALERT - BLOOD CULTURE IDENTIFICATION (BCID)  Alexander Greene is an 66 y.o. male who presented to [facility] on 06/22/2022 with a chief complaint of wound infection   Name of physician (or Provider) Contacted: Yap (CCM)  Current antibiotics: vancomycin  Changes to prescribed antibiotics recommended:  Recommendations accepted by provider  Results for orders placed or performed during the hospital encounter of 06/05/2022  Blood Culture ID Panel (Reflexed) (Collected: 06/28/2022 11:24 AM)  Result Value Ref Range   Enterococcus faecalis NOT DETECTED NOT DETECTED   Enterococcus Faecium NOT DETECTED NOT DETECTED   Listeria monocytogenes NOT DETECTED NOT DETECTED   Staphylococcus species DETECTED (A) NOT DETECTED   Staphylococcus aureus (BCID) NOT DETECTED NOT DETECTED   Staphylococcus epidermidis DETECTED (A) NOT DETECTED   Staphylococcus lugdunensis NOT DETECTED NOT DETECTED   Streptococcus species NOT DETECTED NOT DETECTED   Streptococcus agalactiae NOT DETECTED NOT DETECTED   Streptococcus pneumoniae NOT DETECTED NOT DETECTED   Streptococcus pyogenes NOT DETECTED NOT DETECTED   A.calcoaceticus-baumannii NOT DETECTED NOT DETECTED   Bacteroides fragilis NOT DETECTED NOT DETECTED   Enterobacterales NOT DETECTED NOT DETECTED   Enterobacter cloacae complex NOT DETECTED NOT DETECTED   Escherichia coli NOT DETECTED NOT DETECTED   Klebsiella aerogenes NOT DETECTED NOT DETECTED   Klebsiella oxytoca NOT DETECTED NOT DETECTED   Klebsiella pneumoniae NOT DETECTED NOT DETECTED   Proteus species NOT DETECTED NOT DETECTED   Salmonella species NOT DETECTED NOT DETECTED   Serratia marcescens NOT DETECTED NOT DETECTED   Haemophilus influenzae NOT DETECTED NOT DETECTED   Neisseria meningitidis NOT DETECTED NOT DETECTED   Pseudomonas aeruginosa NOT DETECTED NOT DETECTED   Stenotrophomonas maltophilia NOT DETECTED NOT DETECTED   Candida albicans NOT  DETECTED NOT DETECTED   Candida auris NOT DETECTED NOT DETECTED   Candida glabrata NOT DETECTED NOT DETECTED   Candida krusei NOT DETECTED NOT DETECTED   Candida parapsilosis NOT DETECTED NOT DETECTED   Candida tropicalis NOT DETECTED NOT DETECTED   Cryptococcus neoformans/gattii NOT DETECTED NOT DETECTED   Methicillin resistance mecA/C NOT DETECTED NOT DETECTED    [name] 06/12/2022  5:13 AM

## 2022-06-12 NOTE — Progress Notes (Signed)
[facility] ADULT ICU REPLACEMENT PROTOCOL   The patient does apply for the [facility] Adult ICU Electrolyte Replacment Protocol based on the criteria listed below:   1.Exclusion criteria: TCTS patients, ECMO patients, and Dialysis patients 2. Is GFR >/= 30 ml/min? Yes.    Patient's GFR today is >60 3. Is SCr </= 2? Yes.   Patient's SCr is 0.86 mg/dL 4. Did SCr increase >/= 0.5 in 24 hours? No. 5.Pt's weight >40kg  Yes.   6. Abnormal electrolyte(s): K+ 2.6, Mag 1.7  7. Electrolytes replaced per protocol 8.  Call MD STAT for K+ </= 2.5, Phos </= 1, or Mag </= 1 Physician:  Randie Heinz 06/12/2022 1:46 AM

## 2022-06-12 NOTE — Consult Note (Cosign Needed Addendum)
Consultation Note Date: 06/12/2022   Patient Name: Alexander Greene  DOB: [birth year]  MRN: [id number]  Age / Sex: 66 y.o., male  PCP: Patient, No Pcp Per Referring Physician: Chilton Greathouse, MD  Reason for Consultation: Establishing goals of care, "66 YO M, found down, hypernatremic, rhabdo, have been able to contact family. Vascular consulted, needs L AKA and may need right AKA, New staph bacteremia. Vascular recs PC consult."  HPI/Patient Profile: 66 y.o. male  with unknown past medical history presented to ED on 06/08/2022 after being found down on ground for unknown period of time by roommate. Last was seen two days prior. Patient was admitted on 06/28/2022 with acute metabolic encephalopathy in setting of hypernatremia and severe dehydration, acute rhabdomyolysis, hyperkalemia, demand cardiac ischemia, RLL PE and possible LV thrombus, left common femoral artery occlusion, right common iliac artery occlusion, malnutrition, MSSA bacteremia. Vascular surgery was consulted for evaluation of ischemic lower extremities - patient will require iliofemoral embolectomy to the right lower extremity as well as left AKA, but is at high risk for bilateral lower extremity limb loss; needs medical stabilization prior to operative interventions. Cardiology was consulted for the evaluation of decreased LVEF of 20-25% in setting of elevated troponin - given severely decreased LV function, elevated troponin, and ECG abnormalities, patient could be considered for LHC with LVEDP evaluation once medically stable. However, in the setting of multiple acute medical issues of significant severity, urgent catheterization is not indicated and unlikely to modify patient's long-term prognosis.   Clinical Assessment and Goals of Care: I have reviewed medical records including EPIC notes, labs, and imaging. Received report from primary RN - no acute  concerns. RN reports patient is now minimally responsive compared to this morning.   Went to visit patient at bedside - no family/visitors present. Patient was lying in bed asleep - he briefly opens eyes to voice/gentle touch but quickly falls back asleep. He is not verbally responsive. No signs or non-verbal gestures of pain or discomfort noted. No respiratory distress, increased work of breathing, or secretions noted. Coretrak in use with tube feeds. He is very sick and frail appearing.  5:46 PM Attempted to call relative/Alexander to discuss diagnosis, prognosis, GOC, EOL wishes, disposition, and options - no answer - unable to leave confidential voicemail.   Detailed chart review performed - unable to locate any other family or friend's contact information.   Will continue to attempt contact with [facility]. If patient encephalopathy improves and he is able to make complex medical decisions, will attempt GOC with him. Unfortunately, at this time patient does not have a Management consultant for GOC discussions.   Primary Decision Maker: Patient unable to make medical decisions; no family/friends available    SUMMARY OF RECOMMENDATIONS   Unable to contact relative/Alexander Greene - unable to leave VM. Will continue to attempt contact.  Detailed chart review performed - unable to locate any other family/friend information If patient's encephalopathy improves where he is able to make complex medical decisions, will attempt GOC with patient Unfortunately, at this time patient does not have legal decision maker for GOC discussions PMT will continue to follow and support holistically   Code Status/Advance Care Planning: Full code  Palliative Prophylaxis:  Aspiration, Bowel Regimen, Delirium Protocol, Frequent Pain Assessment, Oral Care, and Turn Reposition  Additional Recommendations (Limitations, Scope, Preferences): Full Scope Treatment  Psycho-social/Spiritual:  Desire for further Chaplaincy  support:no Created space and opportunity for patient to express thoughts and feelings regarding patient's current medical situation.  Emotional support and therapeutic listening provided.  Prognosis:  Poor  Discharge Planning: To Be Determined      Primary Diagnoses: Present on Admission:  Metabolic encephalopathy   I have reviewed the medical record, interviewed the patient and family, and examined the patient. The following aspects are pertinent.  No past medical history on file. Social History   Socioeconomic History   Marital status: Single    Spouse name: Not on file   Number of children: Not on file   Years of education: Not on file   Highest education level: Not on file  Occupational History   Not on file  Tobacco Use   Smoking status: Not on file   Smokeless tobacco: Not on file  Substance and Sexual Activity   Alcohol use: Not on file   Drug use: Not on file   Sexual activity: Not on file  Other Topics Concern   Not on file  Social History Narrative   Not on file   Social Determinants of Health   Financial Resource Strain: Not on file  Food Insecurity: Not on file  Transportation Needs: Not on file  Physical Activity: Not on file  Stress: Not on file  Social Connections: Not on file   No family history on file. Scheduled Meds:  Chlorhexidine Gluconate Cloth  6 each Topical q morning   insulin aspart  2-6 Units Subcutaneous Q4H   multivitamin with minerals  1 tablet Per Tube Daily   mouth rinse  15 mL Mouth Rinse 4 times per day   pantoprazole  40 mg Per Tube Daily   thiamine  100 mg Per Tube Daily   Continuous Infusions:   ceFAZolin (ANCEF) IV 200 mL/hr at 06/12/22 1500   feeding supplement (VITAL AF 1.2 CAL) 20 mL/hr at 06/12/22 1500   heparin 1,000 Units/hr (06/12/22 1500)   potassium PHOSPHATE IVPB (in mmol) Stopped (06/12/22 1458)   PRN Meds:.acetaminophen (TYLENOL) oral liquid 160 mg/5 mL, docusate sodium, HYDROmorphone (DILAUDID)  injection, mouth rinse, polyethylene glycol Medications Prior to Admission:  Prior to Admission medications   Medication Sig Start Date End Date Taking? Authorizing Provider  methocarbamol (ROBAXIN) 500 MG tablet Take 1 tablet (500 mg total) by mouth 2 (two) times daily. 04/05/21   Maxwell Caul, PA-C   No Known Allergies Review of Systems  Unable to perform ROS: Acuity of condition    Physical Exam Vitals and nursing note reviewed.  Constitutional:      General: He is not in acute distress.    Appearance: He is cachectic. He is ill-appearing.  Pulmonary:     Effort: No respiratory distress.  Skin:    General: Skin is warm and dry.  Neurological:     Mental Status: He is lethargic.     Motor: Weakness present.     Vital Signs: BP 97/71   Pulse 95   Temp (!) 97.5 F (36.4 C) (Axillary)   Resp 12   Ht 5\' 11"  (1.803 m)   Wt 51.3 kg   SpO2 91%   BMI 15.77 kg/m  Pain Scale: CPOT       SpO2: SpO2: 91 % O2 Device:SpO2: 91 % O2 Flow Rate: .   IO: Intake/output summary:  Intake/Output Summary (Last 24 hours) at 06/12/2022 1653 Last data filed at 06/12/2022 1500 Gross per 24 hour  Intake 2622.2 ml  Output 840 ml  Net 1782.2 ml    LBM: Last BM Date :  (pta) Baseline Weight: Weight: 68 kg  Most recent weight: Weight: 51.3 kg     Palliative Assessment/Data: PPS 30% with tube feeds     Time In: 1615 Time Out: 1715 Time Total: 60 minutes  Greater than 50%  of this time was spent counseling and coordinating care related to the above assessment and plan.  Signed by: Haskel Khan, NP   Please contact Palliative Medicine Team phone at [phone] for questions and concerns.  For individual provider: See Amion  *Portions of this note are a verbal dictation therefore any spelling and/or grammatical errors are due to the "[employer]agon [employer] One" system interpretation.

## 2022-06-12 NOTE — Progress Notes (Signed)
Pt w/ critical sodium 164, Elink updated.

## 2022-06-12 NOTE — Progress Notes (Signed)
Date and time results received: 06/12/22 0840 (use smartphrase ".now" to insert current time)  Test: Na, Troponin Critical Value: 163, 1345  Name of Provider Notified: Deatra Ina, NP  Orders Received? Or Actions Taken?:  Noted by NP

## 2022-06-12 NOTE — Progress Notes (Signed)
Care Contact Phone Call  Called Alphonse Asbridge Vowles's relative, Loel Ro, at [phone], [phone] (H) and [phone] (M), . They did not answer the phone at this time and were not able to be updated on Mukhtar W Nocera's clinical status and plan. Message left on the [phone] number.  [name]., MSN, APRN, AGACNP-BC [facility]  06/12/2022 , 12:52 PM  Please see [email or web address] for pager details  If no response, please call [phone] After hours, please call Elink at [phone]

## 2022-06-12 NOTE — Plan of Care (Signed)

## 2022-06-12 NOTE — Progress Notes (Signed)
Initial Nutrition Assessment  DOCUMENTATION CODES:   Severe malnutrition in context of social or environmental circumstances, Underweight  INTERVENTION:   Initiate tube feeding via Cortrak tube: Vital AF 1.2 at 20 ml/h and increase by 10 ml every 12 hours to goal rate of 60 ml/h  (1440 ml per day)  Provides 1728 kcal, 108 gm protein, 1167 ml free water daily   200 ml free water every 6 hours Total free water: 1967 ml  MVI with minerals daily  Spoke with Pharmacist, will give additional 15 mmol of phos and recheck lab in am.   Monitor magnesium and phosphorus every 12 hours x 4 occurrences, MD to replete as needed, as pt is at risk for refeeding syndrome given severe malnutrition.  NUTRITION DIAGNOSIS:   Severe Malnutrition related to social / environmental circumstances as evidenced by severe muscle depletion, severe fat depletion.  GOAL:   Patient will meet greater than or equal to 90% of their needs  MONITOR:   TF tolerance  REASON FOR ASSESSMENT:   Consult Enteral/tube feeding initiation and management  ASSESSMENT:   Pt admitted 9/13 after being found down by neighbor, last seen 2 days PTA. Pt with acute metabolic encephalopathy in setting of severe dehydration, hypernatremia, hyperglycemia, elevated CK/AST/ALT, ischemic LLE with MSSA bacteremia, acute rhabdomyolysis, RLL PE.   Pt discussed during ICU rounds and with RN and MD.  Noted decrease in phosphorus after D5 started. Level went to 1.0 after receiving 204 grams of carbohydrate in his D5.  Spoke with PA ok to start TF. Will advance slowly due to ongoing refeeding risk.  D5 stopped at 1207 after sodium lowered to 156 at time of last lab draw  Pt unable to answer any questions, does make eye contact after name called. No family present, neighbor called EMS.   Medications reviewed and include: SSI, protonix  D5 @ 75 ml/hr  Heparin Kphos x 1   Labs reviewed: Na 159 -> 157 -> 156, K 3.2, PO4 1.0 CBG  181 A1C: 12.6  NUTRITION - FOCUSED PHYSICAL EXAM:  Flowsheet Row Most Recent Value  Orbital Region Severe depletion  Upper Arm Region Severe depletion  Thoracic and Lumbar Region Severe depletion  Buccal Region Severe depletion  Temple Region Severe depletion  Clavicle Bone Region Severe depletion  Clavicle and Acromion Bone Region Severe depletion  Scapular Bone Region Severe depletion  Dorsal Hand Severe depletion  Patellar Region Severe depletion  Anterior Thigh Region Severe depletion  Posterior Calf Region Severe depletion  Edema (RD Assessment) None  Hair Reviewed  Eyes Reviewed  Mouth Unable to assess  Skin Reviewed  Nails Reviewed       Diet Order:   Diet Order             Diet NPO time specified  Diet effective now                   EDUCATION NEEDS:   No education needs have been identified at this time  Skin:  Skin Assessment: Skin Integrity Issues: Skin Integrity Issues:: DTI DTI: R knee  Last BM:  unknown  Height:   Ht Readings from Last 1 Encounters:  07/02/2022 5\' 11"  (1.803 m)    Weight:   Wt Readings from Last 1 Encounters:  06/12/22 51.3 kg   BMI:  Body mass index is 15.77 kg/m.  Estimated Nutritional Needs:   Kcal:  1600-1800  Protein:  80-100 grams  Fluid:  >1.6 L/day  06/14/22., RD, LDN,  CNSC See AMiON for contact information

## 2022-06-12 NOTE — Consult Note (Signed)
Cardiology Consultation   Patient ID: Alexander Greene MRN: [id number]; DOB: [birth year]  Admit date: 06/15/2022 Date of Consult: 06/12/2022  PCP:  Patient, No Pcp Per   [place] Providers Cardiologist:  None        Patient Profile:   Alexander Greene is a 66 y.o. male with unknown medical history who is being seen 06/12/2022 for the evaluation of decreased LVEF of 20-25% in the setting of elevated troponin at the request of Hillery Aldo, critical care NP.  History of Present Illness:   Alexander Greene was brought to [facility] emergency department after being found in an unresponsive state by a neighbor. Per neighbor, patient's last known normal was 2 days prior. In the ED, patient was found hypothermic with a temp of 95.9 F, noted to appear dehydrated and cachectic. Labs notable for hypernatremia, Na at 164. Troponin 596, 690, 1345. Ck elevated to 2480. CT head/c-spine no acute intracerebral abnormalities. 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 Patient was placed on a heparin infusion and seen by vascular surgery who indicated patient will need above knee left leg amputation. Patient also had a TTE, notable for LVEF 20-25%, global hypokinesis, with elevated LA pressures.  No family is at bedside. I spoke with ICU staff and they advise that no one has been able to reach family or friends to gain additional medical history.    No past medical history on file.  No past surgical history on file.   Home Medications:  Prior to Admission medications   Medication Sig Start Date End Date Taking? Authorizing Provider  methocarbamol (ROBAXIN) 500 MG tablet Take 1  tablet (500 mg total) by mouth 2 (two) times daily. 04/05/21   Volanda Napoleon, PA-C    Inpatient Medications: Scheduled Meds:  Chlorhexidine Gluconate Cloth  6 each Topical q morning   insulin aspart  2-6 Units Subcutaneous Q4H   multivitamin with minerals  1 tablet Per Tube Daily   mouth rinse  15 mL Mouth Rinse 4 times per day   pantoprazole  40 mg Per Tube Daily   thiamine  100 mg Per Tube Daily   Continuous Infusions:   ceFAZolin (ANCEF) IV 200 mL/hr at 06/12/22 1500   feeding supplement (VITAL AF 1.2 CAL) 20 mL/hr at 06/12/22 1500   heparin 1,000 Units/hr (06/12/22 1500)   potassium PHOSPHATE IVPB (in mmol) Stopped (06/12/22 1458)   PRN Meds: acetaminophen (TYLENOL) oral liquid 160 mg/5 mL, docusate sodium, HYDROmorphone (DILAUDID) injection, mouth rinse, polyethylene glycol  Allergies:   No Known Allergies  Social History:   Social History   Socioeconomic History   Marital status: Single    Spouse name: Not on file   Number of children: Not on file   Years of education: Not on file   Highest education level: Not on file  Occupational History   Not on file  Tobacco Use   Smoking status: Not on file   Smokeless tobacco: Not on file  Substance and Sexual Activity   Alcohol use: Not on file   Drug use: Not on file   Sexual activity: Not on file  Other Topics Concern   Not on file  Social History Narrative  Not on file   Social Determinants of Health   Financial Resource Strain: Not on file  Food Insecurity: Not on file  Transportation Needs: Not on file  Physical Activity: Not on file  Stress: Not on file  Social Connections: Not on file  Intimate Partner Violence: Not on file    Family History:   No family history on file.   ROS:  Please see the history of present illness.   Unable to obtain HPI or complete ROS with patient.  Physical Exam/Data:   Vitals:   06/12/22 1300 06/12/22 1400 06/12/22 1500 06/12/22 1600  BP: 97/68 (!) 87/65 97/71    Pulse: 91 85 95   Resp: (!) 25 (!) 23 12   Temp:    (!) 97.5 F (36.4 C)  TempSrc:    Axillary  SpO2: 91% 99% 91%   Weight:      Height:        Intake/Output Summary (Last 24 hours) at 06/12/2022 1637 Last data filed at 06/12/2022 1500 Gross per 24 hour  Intake 2622.2 ml  Output 840 ml  Net 1782.2 ml      06/12/2022    4:00 AM 06/16/2022    3:23 PM 06/20/2022   10:55 AM  Last 3 Weights  Weight (lbs) 113 lb 1.5 oz 119 lb 14.9 oz 150 lb  Weight (kg) 51.3 kg 54.4 kg 68.04 kg     Body mass index is 15.77 kg/m.  General:  Malnourished and chronically ill appearing. No acute distress. HEENT: normal. Extraocular movements intact. Pupils equal and reactive. Neck: no JVD Vascular: No carotid bruits; Distal pulses 2+ bilaterally Cardiac:  normal S1, S2; RRR; no murmur  Lungs:  clear to auscultation anteriorly  Abd: soft, nontender, no hepatomegaly  Ext: left lower extremity is black, cold, bandaged. Right lower extremity is cool to touch.  Musculoskeletal:  No deformities, BUE and BLE strength normal and equal Skin: warm and dry  Neuro:  Patient speaks one or two words at a time. Does not appear to understand questions/does not respond.   EKG:  The EKG was personally reviewed and demonstrates:  9/13 ECG with T wave inversion in leads V3 and V4 with LVH pattern. Normal sinus rhythm.  Telemetry:  Telemetry was personally reviewed and demonstrates:  sinus tachycardia with PVCs.  Relevant CV Studies: IMPRESSIONS     1. No mural thrombus with Definity contrast. Left ventricular ejection  fraction, by estimation, is 20 to 25%. Left ventricular ejection fraction  by 2D MOD biplane is 20.7 %. The left ventricle has severely decreased  function. The left ventricle  demonstrates global hypokinesis. There is moderate left ventricular  hypertrophy. Left ventricular diastolic parameters are indeterminate.   2. Right ventricular systolic function is mildly reduced. The right  ventricular  size is normal. Tricuspid regurgitation signal is inadequate  for assessing PA pressure.   3. Left atrial size was severely dilated.   4. The mitral valve is abnormal. Trivial mitral valve regurgitation.   5. The aortic valve is tricuspid. Aortic valve regurgitation is not  visualized.   6. Aortic dilatation noted. There is borderline dilatation of the aortic  root, measuring 38 mm.   7. The inferior vena cava is normal in size with <50% respiratory  variability, suggesting right atrial pressure of 8 mmHg.   8. Agitated saline contrast bubble study was negative, with no evidence  of any interatrial shunt.   Comparison(s): No prior Echocardiogram.   FINDINGS   Left Ventricle:  No mural thrombus with Definity contrast. Left  ventricular ejection fraction, by estimation, is 20 to 25%. Left  ventricular ejection fraction by 2D MOD biplane is 20.7 %. The left  ventricle has severely decreased function. The left  ventricle demonstrates global hypokinesis. The left ventricular internal  cavity size was normal in size. There is moderate left ventricular  hypertrophy. Left ventricular diastolic parameters are indeterminate.   Right Ventricle: The right ventricular size is normal. No increase in  right ventricular wall thickness. Right ventricular systolic function is  mildly reduced. Tricuspid regurgitation signal is inadequate for assessing  PA pressure.   Left Atrium: Left atrial size was severely dilated.   Right Atrium: Right atrial size was normal in size.   Pericardium: There is no evidence of pericardial effusion.   Mitral Valve: The mitral valve is abnormal. There is moderate thickening  of the anterior and posterior mitral valve leaflet(s). Trivial mitral  valve regurgitation.   Tricuspid Valve: The tricuspid valve is grossly normal. Tricuspid valve  regurgitation is trivial.   Aortic Valve: The aortic valve is tricuspid. Aortic valve regurgitation is  not visualized. Aortic  valve mean gradient measures 4.0 mmHg. Aortic valve  peak gradient measures 7.3 mmHg. Aortic valve area, by VTI measures 1.84  cm.   Pulmonic Valve: The pulmonic valve was normal in structure. Pulmonic valve  regurgitation is not visualized.   Aorta: Aortic dilatation noted. There is borderline dilatation of the  aortic root, measuring 38 mm.   Venous: The inferior vena cava is normal in size with less than 50%  respiratory variability, suggesting right atrial pressure of 8 mmHg.   IAS/Shunts: There is right bowing of the interatrial septum, suggestive of  elevated left atrial pressure. No atrial level shunt detected by color  flow Doppler. Agitated saline contrast was given intravenously to evaluate  for intracardiac shunting.  Agitated saline contrast bubble study was negative, with no evidence of  any interatrial shunt.      LEFT VENTRICLE  PLAX 2D                        Biplane EF (MOD)  LVIDd:         5.50 cm         LV Biplane EF:   Left  LVIDs:         5.00 cm                          ventricular  LV PW:         1.30 cm                          ejection  LV IVS:        1.30 cm                          fraction by  LVOT diam:     2.20 cm                          2D MOD  LV SV:         40                               biplane is  LV SV Index:  24                               20.7 %.  LVOT Area:     3.80 cm                                 Diastology                                 LV e' medial:    7.18 cm/s  LV Volumes (MOD)               LV E/e' medial:  12.6  LV vol d, MOD    157.0 ml      LV e' lateral:   5.44 cm/s  A2C:                           LV E/e' lateral: 16.6  LV vol d, MOD    182.0 ml  A4C:  LV vol s, MOD    130.0 ml  A2C:  LV vol s, MOD    131.0 ml  A4C:  LV SV MOD A2C:   27.0 ml  LV SV MOD A4C:   182.0 ml  LV SV MOD BP:    35.3 ml   RIGHT VENTRICLE  TAPSE (M-mode): 1.8 cm   LEFT ATRIUM              Index        RIGHT ATRIUM           Index  LA  Vol (A2C):   88.5 ml  53.48 ml/m  RA Area:     13.40 cm  LA Vol (A4C):   111.0 ml 67.07 ml/m  RA Volume:   33.00 ml  19.94 ml/m  LA Biplane Vol: 100.0 ml 60.43 ml/m   AORTIC VALVE  AV Area (Vmax):    2.06 cm  AV Area (Vmean):   2.02 cm  AV Area (VTI):     1.84 cm  AV Vmax:           135.00 cm/s  AV Vmean:          97.900 cm/s  AV VTI:            0.219 m  AV Peak Grad:      7.3 mmHg  AV Mean Grad:      4.0 mmHg  LVOT Vmax:         73.10 cm/s  LVOT Vmean:        52.000 cm/s  LVOT VTI:          0.106 m  LVOT/AV VTI ratio: 0.48     AORTA  Ao Root diam: 3.65 cm  Ao Asc diam:  3.20 cm   MITRAL VALVE  MV Area (PHT): 5.34 cm    SHUNTS  MV Decel Time: 142 msec    Systemic VTI:  0.11 m  MV E velocity: 90.40 cm/s  Systemic Diam: 2.20 cm  MV A velocity: 80.10 cm/s  MV E/A ratio:  1.13   Laboratory Data:  High Sensitivity Troponin:   Recent Labs  Lab 06/02/2022 1312 06/01/2022 1517 06/12/22 0628  TROPONINIHS 596* 690* 1,345*     Chemistry Recent Labs  Lab 06/25/2022 1152 05/30/2022  1323 06/03/2022 1715 06/23/2022 2013 06/12/22 0013 06/12/22 0412 06/12/22 0628 06/12/22 1049 06/12/22 1226 06/12/22 1425  NA  --    < > 163*   < > 162* 163*   < > 157* 156* 158*  K  --    < > 2.9*  --  2.6* 3.2*  --   --   --   --   CL  --   --  123*  --  126* 121*  --   --   --   --   CO2  --   --  27  --  30 32  --   --   --   --   GLUCOSE  --   --  346*  --  195* 87  --   --   --   --   BUN  --   --  13  --  7* 8  --   --   --   --   CREATININE  --   --  0.82  --  0.86 0.74  --   --   --   --   CALCIUM  --   --  8.2*  --  8.0* 8.4*  --   --   --   --   MG 2.0  --   --   --  1.7 2.4  --   --   --   --   GFRNONAA  --   --  >60  --  >60 >60  --   --   --   --   ANIONGAP  --   --  13  --  6 10  --   --   --   --    < > = values in this interval not displayed.    Recent Labs  Lab 06/09/2022 1108  PROT 6.9  ALBUMIN 2.4*  AST 77*  ALT 87*  ALKPHOS 124  BILITOT 1.9*   Lipids No results  for input(s): "CHOL", "TRIG", "HDL", "LABVLDL", "LDLCALC", "CHOLHDL" in the last 168 hours.  Hematology Recent Labs  Lab 06/28/2022 1108 06/27/2022 1139 06/21/2022 1517 06/07/2022 1548 06/12/22 0412  WBC 10.3  --   --   --  11.7*  RBC 5.22  --   --   --  4.69  HGB 15.8 16.3 14.3  --  14.0  HCT 52.8* 48.0 42.0  --  45.6  MCV 101.1*  --   --   --  97.2  MCH 30.3  --   --   --  29.9  MCHC 29.9*  --   --   --  30.7  RDW 13.8  --   --   --  13.7  PLT 195  --   --  186 176   Thyroid No results for input(s): "TSH", "FREET4" in the last 168 hours.  BNPNo results for input(s): "BNP", "PROBNP" in the last 168 hours.  DDimer  Recent Labs  Lab 06/15/2022 1548  DDIMER 11.34*     Radiology/Studies:  ECHOCARDIOGRAM COMPLETE  Result Date: 06/12/2022    ECHOCARDIOGRAM REPORT   Patient Name:   Alexander Greene Date of Exam: 06/12/2022 Medical Rec #:  [id number]          Height:       71.0 in Accession #:    [id number]         Weight:       113.1 lb Date of Birth:  09-08-1956  BSA:          1.655 m Patient Age:    45 years           BP:           105/76 mmHg Patient Gender: M                  HR:           96 bpm. Exam Location:  Inpatient Procedure: 2D Echo, Cardiac Doppler, Color Doppler, Intracardiac Opacification            Agent and Saline Contrast Bubble Study Indications:    Pulmonary embolus  History:        Patient has no prior history of Echocardiogram examinations.  Sonographer:    Memory Argue Referring Phys: [place]  1. No mural thrombus with Definity contrast. Left ventricular ejection fraction, by estimation, is 20 to 25%. Left ventricular ejection fraction by 2D MOD biplane is 20.7 %. The left ventricle has severely decreased function. The left ventricle demonstrates global hypokinesis. There is moderate left ventricular hypertrophy. Left ventricular diastolic parameters are indeterminate.  2. Right ventricular systolic function is mildly reduced. The right  ventricular size is normal. Tricuspid regurgitation signal is inadequate for assessing PA pressure.  3. Left atrial size was severely dilated.  4. The mitral valve is abnormal. Trivial mitral valve regurgitation.  5. The aortic valve is tricuspid. Aortic valve regurgitation is not visualized.  6. Aortic dilatation noted. There is borderline dilatation of the aortic root, measuring 38 mm.  7. The inferior vena cava is normal in size with <50% respiratory variability, suggesting right atrial pressure of 8 mmHg.  8. Agitated saline contrast bubble study was negative, with no evidence of any interatrial shunt. Comparison(s): No prior Echocardiogram. FINDINGS  Left Ventricle: No mural thrombus with Definity contrast. Left ventricular ejection fraction, by estimation, is 20 to 25%. Left ventricular ejection fraction by 2D MOD biplane is 20.7 %. The left ventricle has severely decreased function. The left ventricle demonstrates global hypokinesis. The left ventricular internal cavity size was normal in size. There is moderate left ventricular hypertrophy. Left ventricular diastolic parameters are indeterminate. Right Ventricle: The right ventricular size is normal. No increase in right ventricular wall thickness. Right ventricular systolic function is mildly reduced. Tricuspid regurgitation signal is inadequate for assessing PA pressure. Left Atrium: Left atrial size was severely dilated. Right Atrium: Right atrial size was normal in size. Pericardium: There is no evidence of pericardial effusion. Mitral Valve: The mitral valve is abnormal. There is moderate thickening of the anterior and posterior mitral valve leaflet(s). Trivial mitral valve regurgitation. Tricuspid Valve: The tricuspid valve is grossly normal. Tricuspid valve regurgitation is trivial. Aortic Valve: The aortic valve is tricuspid. Aortic valve regurgitation is not visualized. Aortic valve mean gradient measures 4.0 mmHg. Aortic valve peak gradient  measures 7.3 mmHg. Aortic valve area, by VTI measures 1.84 cm. Pulmonic Valve: The pulmonic valve was normal in structure. Pulmonic valve regurgitation is not visualized. Aorta: Aortic dilatation noted. There is borderline dilatation of the aortic root, measuring 38 mm. Venous: The inferior vena cava is normal in size with less than 50% respiratory variability, suggesting right atrial pressure of 8 mmHg. IAS/Shunts: There is right bowing of the interatrial septum, suggestive of elevated left atrial pressure. No atrial level shunt detected by color flow Doppler. Agitated saline contrast was given intravenously to evaluate for intracardiac shunting. Agitated saline contrast bubble study was negative, with no  evidence of any interatrial shunt.  LEFT VENTRICLE PLAX 2D                        Biplane EF (MOD) LVIDd:         5.50 cm         LV Biplane EF:   Left LVIDs:         5.00 cm                          ventricular LV PW:         1.30 cm                          ejection LV IVS:        1.30 cm                          fraction by LVOT diam:     2.20 cm                          2D MOD LV SV:         40                               biplane is LV SV Index:   24                               20.7 %. LVOT Area:     3.80 cm                                Diastology                                LV e' medial:    7.18 cm/s LV Volumes (MOD)               LV E/e' medial:  12.6 LV vol d, MOD    157.0 ml      LV e' lateral:   5.44 cm/s A2C:                           LV E/e' lateral: 16.6 LV vol d, MOD    182.0 ml A4C: LV vol s, MOD    130.0 ml A2C: LV vol s, MOD    131.0 ml A4C: LV SV MOD A2C:   27.0 ml LV SV MOD A4C:   182.0 ml LV SV MOD BP:    35.3 ml RIGHT VENTRICLE TAPSE (M-mode): 1.8 cm LEFT ATRIUM              Index        RIGHT ATRIUM           Index LA Vol (A2C):   88.5 ml  53.48 ml/m  RA Area:     13.40 cm LA Vol (A4C):   111.0 ml 67.07 ml/m  RA Volume:   33.00 ml  19.94 ml/m LA Biplane Vol: 100.0 ml 60.43 ml/m   AORTIC VALVE AV Area (Vmax):    2.06 cm AV Area (Vmean):  2.02 cm AV Area (VTI):     1.84 cm AV Vmax:           135.00 cm/s AV Vmean:          97.900 cm/s AV VTI:            0.219 m AV Peak Grad:      7.3 mmHg AV Mean Grad:      4.0 mmHg LVOT Vmax:         73.10 cm/s LVOT Vmean:        52.000 cm/s LVOT VTI:          0.106 m LVOT/AV VTI ratio: 0.48  AORTA Ao Root diam: 3.65 cm Ao Asc diam:  3.20 cm MITRAL VALVE MV Area (PHT): 5.34 cm    SHUNTS MV Decel Time: 142 msec    Systemic VTI:  0.11 m MV E velocity: 90.40 cm/s  Systemic Diam: 2.20 cm MV A velocity: 80.10 cm/s MV E/A ratio:  1.13 Lyman Bishop MD Electronically signed by Lyman Bishop MD Signature Date/Time: 06/12/2022/1:14:27 PM    Final    DG Abd Portable 1V  Result Date: 06/12/2022 CLINICAL DATA:  Altered mental status EXAM: PORTABLE ABDOMEN - 1 VIEW COMPARISON:  None Available. FINDINGS: Enteric tube passes into the stomach with tip overlying the pyloric region. Bowel gas pattern is unremarkable. IMPRESSION: Enteric tube within the distal stomach. Electronically Signed   By: Macy Mis M.D.   On: 06/12/2022 10:36   DG Chest Port 1 View  Result Date: 06/12/2022 CLINICAL DATA:  Shortness of breath.  Metabolic encephalopathy. EXAM: PORTABLE CHEST 1 VIEW COMPARISON:  Yesterday FINDINGS: Numerous leads and wires project over the chest. Midline trachea. Borderline cardiomegaly. No pleural effusion or pneumothorax. Clear lungs. IMPRESSION: Borderline cardiomegaly, without acute disease. Electronically Signed   By: Abigail Miyamoto M.D.   On: 06/12/2022 08:22   CT CHEST ABDOMEN PELVIS W CONTRAST  Result Date: 05/30/2022 CLINICAL DATA:  A 66 year old male presents with history of sepsis. EXAM: CT CHEST, ABDOMEN, AND PELVIS WITH CONTRAST TECHNIQUE: Multidetector CT imaging of the chest, abdomen and pelvis was performed following the standard protocol during bolus administration of intravenous contrast. RADIATION DOSE REDUCTION: This exam was  performed according to the departmental dose-optimization program which includes automated exposure control, adjustment of the mA and/or kV according to patient size and/or use of iterative reconstruction technique. CONTRAST:  41mL OMNIPAQUE IOHEXOL 350 MG/ML SOLN COMPARISON:  Cervical spine evaluation performed the same date. FINDINGS: CT CHEST FINDINGS Cardiovascular: Low-attenuation about papillary muscles near the LEFT ventricular apex raising the question of small amounts of thrombus adherent to papillary musculature in the LEFT ventricle. LEFT ventricular dilation. No LEFT atrial thrombus. Signs of RIGHT lower lobe pulmonary artery emboli in basilar segmental level branches (image 37/3) Central pulmonary vasculature is normal caliber. The aorta is normal caliber with smooth contour. Mediastinum/Nodes: No signs of adenopathy in the chest. Esophagus is grossly normal. Lungs/Pleura: No lobar consolidation. RIGHT upper lobe pulmonary nodule (image 35/5) 8 x 6 mm. Airways are patent. Small bilateral pleural effusions are present layering dependently in the chest. No pneumothorax. Musculoskeletal: See below for full musculoskeletal details. CT ABDOMEN PELVIS FINDINGS Hepatobiliary: Hepatic steatosis. Smooth hepatic contours. Patent portal vein. No suspicious hepatic lesion. No acute biliary process. Pancreas: Mild atrophy of the pancreas without ductal dilation, inflammation or visible lesion. 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 Adrenals/Urinary Tract: Adrenal glands are normal.  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 Heterogeneity in general of the nephrogram a stray shin seen bilaterally. Striated nephrogram can also be seen in the setting of pyelonephritis but there is at least 1 area that is strongly suggestive of if not  diagnostic for renal infarct. Other areas also show focal characteristics either infarct or focal nephritis. Renal veins are patent. Stomach/Bowel: No stranding adjacent to the stomach. No small bowel obstruction. Mild generalize stranding in the mesentery of the small bowel, of uncertain significance in the setting of small bilateral effusions and body wall edema. This may be developing generalized anasarca. 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 Chronicity uncertain, potentially acute/recent though with reconstitution of flow seen in superficial and deep femoral arteries distal to the site of occlusion in the upper thigh. Also however with occluded superficial femoral artery on the LEFT (image 117/3) No signs of adenopathy in the abdomen or the pelvis. Remaining branches of the abdominal aorta grossly patent this venous phase assessment. Reproductive: Prostate mild to moderately enlarged. Urinary bladder without focal thickening or gross perivesical stranding. Other: Body wall edema.  No ascites.  No pneumoperitoneum. Musculoskeletal: No acute bone finding. No destructive bone process. Spinal degenerative changes. IMPRESSION: 1. RIGHT lower lobe pulmonary artery emboli in basilar segmental level branches. 2. 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 Given focal appearance of some of these areas would suggest 3 to six-month follow-up to ensure no underlying lesion is present. 3. 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 Findings are suspicious for recent and or acute occlusion superimposed on chronic  vascular disease. 4. 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 Vascular surgery consultation is suggested. 5. 7 mm right solid pulmonary nodule within the upper lobe. Per Fleischner Society Guidelines, recommend a non-contrast Chest CT at 6-12 months. If patient is high risk for malignancy, recommend an additional non-contrast Chest CT at 18-24 months; if patient is low risk for malignancy a non-contrast Chest CT at 18-24 months is optional. These guidelines do not apply to immunocompromised patients and patients with cancer. Follow up in patients with significant comorbidities as clinically warranted. For lung cancer screening, adhere to Lung-RADS guidelines. Reference: Radiology. 2017; 284(1):228-43. Critical Value/emergent results were called by telephone at the time of interpretation on 06/06/2022 at 3:01 pm to provider [facility] , who verbally acknowledged these results. Electronically Signed   By: Donzetta Kohut M.D.   On: 06/24/2022 15:01   CT Head Wo Contrast  Result Date: 05/30/2022 CLINICAL DATA:  Altered mental status, neck injury. EXAM: CT HEAD WITHOUT CONTRAST CT CERVICAL SPINE WITHOUT CONTRAST TECHNIQUE: Multidetector CT imaging of the head and cervical spine was performed following the standard protocol without intravenous contrast. Multiplanar CT image reconstructions of the cervical spine were also generated. Unfortunately, exam is limited due to patient motion artifact. RADIATION DOSE REDUCTION: This exam was performed according to the departmental dose-optimization program which includes automated exposure control, adjustment of the mA and/or kV  according to patient size and/or use of iterative reconstruction technique. COMPARISON:  April 05, 2021. FINDINGS: CT HEAD FINDINGS Brain: No evidence of acute infarction, hemorrhage, hydrocephalus, extra-axial collection or mass lesion/mass effect.  Vascular: No hyperdense vessel or unexpected calcification. Skull: Normal. Negative for fracture or focal lesion. Sinuses/Orbits: No acute finding. Other: None. CT CERVICAL SPINE FINDINGS Alignment: Normal. Skull base and vertebrae: Only the first 6 cervical vertebra are well visualized due to patient motion artifact. No fracture or bony abnormality is involving these. Pathology involving C7 cannot be excluded on the basis of this exam. Soft tissues and spinal canal: No prevertebral fluid or swelling seen in visualized areas. No visible canal hematoma. Disc levels:  Moderate degenerative disc disease is noted at C3-4. Upper chest: Not visualized due to patient motion artifact. Other: None. IMPRESSION: No definite acute intracranial abnormality seen. Due to patient motion artifact, only the first 6 of cervical vertebra are well visualized. No fracture or spondylolisthesis is seen involving these visualized vertebra. Fracture or other pathology involving C7 vertebra cannot be excluded on the basis of this exam. Electronically Signed   By: Marijo Conception M.D.   On: 06/17/2022 13:21   CT Cervical Spine Wo Contrast  Result Date: 06/23/2022 CLINICAL DATA:  Altered mental status, neck injury. EXAM: CT HEAD WITHOUT CONTRAST CT CERVICAL SPINE WITHOUT CONTRAST TECHNIQUE: Multidetector CT imaging of the head and cervical spine was performed following the standard protocol without intravenous contrast. Multiplanar CT image reconstructions of the cervical spine were also generated. Unfortunately, exam is limited due to patient motion artifact. RADIATION DOSE REDUCTION: This exam was performed according to the departmental dose-optimization program which includes automated exposure control, adjustment of the mA and/or kV according to patient size and/or use of iterative reconstruction technique. COMPARISON:  April 05, 2021. FINDINGS: CT HEAD FINDINGS Brain: No evidence of acute infarction, hemorrhage, hydrocephalus,  extra-axial collection or mass lesion/mass effect. Vascular: No hyperdense vessel or unexpected calcification. Skull: Normal. Negative for fracture or focal lesion. Sinuses/Orbits: No acute finding. Other: None. CT CERVICAL SPINE FINDINGS Alignment: Normal. Skull base and vertebrae: Only the first 6 cervical vertebra are well visualized due to patient motion artifact. No fracture or bony abnormality is involving these. Pathology involving C7 cannot be excluded on the basis of this exam. Soft tissues and spinal canal: No prevertebral fluid or swelling seen in visualized areas. No visible canal hematoma. Disc levels:  Moderate degenerative disc disease is noted at C3-4. Upper chest: Not visualized due to patient motion artifact. Other: None. IMPRESSION: No definite acute intracranial abnormality seen. Due to patient motion artifact, only the first 6 of cervical vertebra are well visualized. No fracture or spondylolisthesis is seen involving these visualized vertebra. Fracture or other pathology involving C7 vertebra cannot be excluded on the basis of this exam. Electronically Signed   By: Marijo Conception M.D.   On: 06/28/2022 13:21   DG Chest Port 1 View  Result Date: 05/30/2022 : Questionable sepsis - evaluate for abnormality EXAM: PORTABLE CHEST 1 VIEW COMPARISON:  Chest x-ray 04/05/2021. FINDINGS: Mild linear right basilar opacity. No confluent consolidation. No visible pleural effusions pneumothorax pre cardiomediastinal silhouette is within normal limits when accounting for technique soft were taken. IMPRESSION: Mild linear right basilar opacity, favor subsegmental atelectasis. No confluent consolidation. Electronically Signed   By: Margaretha Sheffield M.D.   On: 05/31/2022 11:41     Assessment and Plan:   Acute HFrEF (20-25%) Elevated troponin  Patient with unknown medical history presenting after  being found unresponsive.  TTE obtained this admission is notable for severe LV dysfunction, global  hypokinesis, and EF 20-25%. RV size is normal. Troponin 596, 690, 1345 and ECG concerning for anterior ischemic changes. Unfortunately no additional medical history has been obtained, appears that patient does not have family familiar with his history. Patient is currently being treated in the ICU for multiple thrombus related issues including right lower lobe pulmonary embolism, suspicion for thrombus in LV associated with papillary muscle, "associated with end-organ infarcts in the spleen and LEFT kidney, also likely with smaller areas of infarct in the RIGHT kidney." Patient also with occlusion of right common iliac artery, focal occlusion of left common femoral artery. Vascular surgery has seen and determined that left leg will require above the knee amputation.    Given severely decreased LV function, elevated troponin, and ECG abnormalities, patient could be considered for LHC with LVEDP evaluation once medically stable. However, in the setting of multiple acute medical issues of significant severity, urgent catheterization is not indicated and unlikely to modify patient's long-term prognosis.  Continue heparin infusion    Risk Assessment/Risk Scores:     TIMI Risk Score for Unstable Angina or Non-ST Elevation MI:   The patient's TIMI risk score is 3, which indicates a 13% risk of all cause mortality, new or recurrent myocardial infarction or need for urgent revascularization in the next 14 days. This score is limited by unknown medical history of patient.{  For questions or updates, please contact [place] Please consult [email or web address] for contact info under    Signed, Lily Kocher, PA-C  06/12/2022 4:37 PM

## 2022-06-12 NOTE — Procedures (Signed)
Cortrak  Person Inserting Tube:  Clovis Riley, Philopateer Strine L, RD Tube Type:  Cortrak - 43 inches Tube Size:  10 Tube Location:  Left nare Secured by: Bridle Technique Used to Measure Tube Placement:  Marking at nare/corner of mouth Cortrak Secured At:  72 cm   Cortrak Tube Team Note:  Consult received to place a Cortrak feeding tube.   X-ray is required, abdominal x-ray has been ordered by the Cortrak team. Please confirm tube placement before using the Cortrak tube.   If the tube becomes dislodged please keep the tube and contact the Cortrak team at [email or web address] ([email or web address]) for replacement.  If after hours and replacement cannot be delayed, place a NG tube and confirm placement with an abdominal x-ray.    Kirby Crigler RD, LDN Clinical Dietitian See Loretha Stapler for contact information.

## 2022-06-13 ENCOUNTER — Inpatient Hospital Stay (HOSPITAL_COMMUNITY): Payer: Self-pay

## 2022-06-13 DIAGNOSIS — R911 Solitary pulmonary nodule: Secondary | ICD-10-CM

## 2022-06-13 DIAGNOSIS — Z711 Person with feared health complaint in whom no diagnosis is made: Secondary | ICD-10-CM

## 2022-06-13 LAB — BASIC METABOLIC PANEL
Anion gap: 9 (ref 5–15)
BUN: 10 mg/dL (ref 8–23)
CO2: 29 mmol/L (ref 22–32)
Calcium: 7.8 mg/dL — ABNORMAL LOW (ref 8.9–10.3)
Chloride: 114 mmol/L — ABNORMAL HIGH (ref 98–111)
Creatinine, Ser: 0.73 mg/dL (ref 0.61–1.24)
GFR, Estimated: >60 mL/min (ref >60)
Glucose, Bld: 137 mg/dL — ABNORMAL HIGH (ref 70–99)
Potassium: 2.8 mmol/L — ABNORMAL LOW (ref 3.5–5.1)
Sodium: 152 mmol/L — ABNORMAL HIGH (ref 135–145)

## 2022-06-13 LAB — LACTIC ACID, PLASMA: Lactic Acid, Venous: 2.1 mmol/L (ref 0.5–1.9)

## 2022-06-13 LAB — CBC
HCT: 40.5 % (ref 39.0–52.0)
Hemoglobin: 12.5 g/dL — ABNORMAL LOW (ref 13.0–17.0)
MCH: 29.5 pg (ref 26.0–34.0)
MCHC: 30.9 g/dL (ref 30.0–36.0)
MCV: 95.5 fL (ref 80.0–100.0)
Platelets: 169 10*3/uL (ref 150–400)
RBC: 4.24 MIL/uL (ref 4.22–5.81)
RDW: 13.5 % (ref 11.5–15.5)
WBC: 9 10*3/uL (ref 4.0–10.5)
nRBC: 0 % (ref 0.0–0.2)

## 2022-06-13 LAB — GLUCOSE, CAPILLARY
Glucose-Capillary: 109 mg/dL — ABNORMAL HIGH (ref 70–99)
Glucose-Capillary: 129 mg/dL — ABNORMAL HIGH (ref 70–99)
Glucose-Capillary: 130 mg/dL — ABNORMAL HIGH (ref 70–99)
Glucose-Capillary: 134 mg/dL — ABNORMAL HIGH (ref 70–99)
Glucose-Capillary: 158 mg/dL — ABNORMAL HIGH (ref 70–99)
Glucose-Capillary: 172 mg/dL — ABNORMAL HIGH (ref 70–99)

## 2022-06-13 LAB — TROPONIN I (HIGH SENSITIVITY): Troponin I (High Sensitivity): 640 ng/L (ref <18)

## 2022-06-13 LAB — HEPARIN LEVEL (UNFRACTIONATED)
Heparin Unfractionated: 0.22 IU/mL — ABNORMAL LOW (ref 0.30–0.70)
Heparin Unfractionated: 0.34 IU/mL (ref 0.30–0.70)

## 2022-06-13 LAB — SODIUM
Sodium: 153 mmol/L — ABNORMAL HIGH (ref 135–145)
Sodium: 149 mmol/L — ABNORMAL HIGH (ref 135–145)

## 2022-06-13 LAB — MAGNESIUM
Magnesium: 1.9 mg/dL (ref 1.7–2.4)
Magnesium: 2 mg/dL (ref 1.7–2.4)

## 2022-06-13 LAB — PHOSPHORUS
Phosphorus: 3.1 mg/dL (ref 2.5–4.6)
Phosphorus: 2.1 mg/dL — ABNORMAL LOW (ref 2.5–4.6)

## 2022-06-13 MED ORDER — POTASSIUM CHLORIDE 20 MEQ PO PACK
40.0000 meq | PACK | Freq: Once | ORAL | Status: AC
  Administered 2022-06-13: 40 meq
  Filled 2022-06-13: qty 2

## 2022-06-13 MED ORDER — INSULIN GLARGINE-YFGN 100 UNIT/ML ~~LOC~~ SOLN
10.0000 [IU] | Freq: Every day | SUBCUTANEOUS | Status: DC
  Administered 2022-06-13 – 2022-06-17 (×5): 10 [IU] via SUBCUTANEOUS
  Filled 2022-06-13 (×5): qty 0.1

## 2022-06-13 MED ORDER — POTASSIUM CHLORIDE 10 MEQ/100ML IV SOLN
10.0000 meq | INTRAVENOUS | Status: AC
  Administered 2022-06-13 (×4): 10 meq via INTRAVENOUS
  Filled 2022-06-13 (×3): qty 100

## 2022-06-13 NOTE — Progress Notes (Signed)
  Progress Note    06/13/2022 8:08 AM * No surgery found *  Subjective:  Patient is sleeping    Vitals:   06/13/22 0602 06/13/22 0700  BP:  91/66  Pulse:  93  Resp:  (!) 8  Temp:    SpO2: 100% 99%    Physical Exam: Lungs:  nonlabored Extremities:  LLE with wounds and cold. RLE is cool to the touch   CBC    Component Value Date/Time   WBC 11.7 (H) 06/12/2022 0412   RBC 4.69 06/12/2022 0412   HGB 14.0 06/12/2022 0412   HGB 14.2 11/24/2012 1401   HCT 45.6 06/12/2022 0412   HCT 42.8 11/24/2012 1401   PLT 176 06/12/2022 0412   PLT 279 11/24/2012 1401   MCV 97.2 06/12/2022 0412   MCV 89 11/24/2012 1401   MCH 29.9 06/12/2022 0412   MCHC 30.7 06/12/2022 0412   RDW 13.7 06/12/2022 0412   RDW 14.9 (H) 11/24/2012 1401   LYMPHSABS 0.7 06/09/2022 1108   MONOABS 0.4 06/27/2022 1108   EOSABS 0.0 06/02/2022 1108   BASOSABS 0.0 06/20/2022 1108    BMET    Component Value Date/Time   NA 153 (H) 06/13/2022 0158   NA 142 11/24/2012 1401   K 3.2 (L) 06/12/2022 0412   K 3.7 11/24/2012 1401   CL 121 (H) 06/12/2022 0412   CL 107 11/24/2012 1401   CO2 32 06/12/2022 0412   CO2 28 11/24/2012 1401   GLUCOSE 87 06/12/2022 0412   GLUCOSE 131 (H) 11/24/2012 1401   BUN 8 06/12/2022 0412   BUN 16 11/24/2012 1401   CREATININE 0.74 06/12/2022 0412   CREATININE 1.02 11/24/2012 1401   CALCIUM 8.4 (L) 06/12/2022 0412   CALCIUM 9.3 11/24/2012 1401   GFRNONAA >60 06/12/2022 0412    INR    Component Value Date/Time   INR 1.3 (H) 06/07/2022 1548     Intake/Output Summary (Last 24 hours) at 06/13/2022 0938 Last data filed at 06/13/2022 0600 Gross per 24 hour  Intake 2184.36 ml  Output 300 ml  Net 1884.36 ml     Assessment/Plan:  66 y.o. male with ischemic wounds bilaterally     -No change in mental status or exam. Patient is not medically stable for intervention. Would likely require bilateral AKAs -Echo yesterday revealed no sign of vegetations. Cardiology would consider  TEE for PFO if patient improves  -Patient has been made DNR  Loel Dubonnet, PA-C [facility] [phone] 06/13/2022 8:08 AM

## 2022-06-13 NOTE — Progress Notes (Signed)
NAME:  Alexander Greene, MRN:  [id number], DOB:  08/18/1956, LOS: 2 ADMISSION DATE:  2022-07-09 CONSULTATION DATE:  07-09-2022 REFERRING MD:  Vernia Buff - EDP CHIEF COMPLAINT:  AMS, severe dehydration   History of Present Illness:  67 year old man who presented to [facility] ED 9/13 after being found down at his home by a neighbor. Last seen two days PTA. No known past medical history.  On ED arrival, patient was mildly hypothermic with HR 91, BP 131/81, RR 21, SpO2 99% on RA. Appeared severely dehydrated and cachectic. Labs demonstrated CBC WNL, Na 164, Cl 126, glucose 327, renal function WNL. Mildly elevated AST/ALT and Tbili. Trop 596 > 690. VBG 7.464/44/101/32.2; UA glucose > 500, moderate Hgb, +ketones/protein. CK 2897. LA 2.3. Ethanol/UDS negative. COVID/Flu negative. CXR with mild linear R basilar opacity, likely atelectasis. CT Head/Cspine with NAICA, but motion-degraded exam. CT Chest/A/P demonstrated RLL PE, c/f small LV thombus associated with papillary muscle, small splenic/renal infarcts, R common iliac artery occlusion, L common femoral artery occlusion. VVS was consulted for evaluation. Heparin started for multiple thromboembolic issues. Patient was given Ativan for CT scans and became somnolent requiring flumazenil administration. D5 was initiated for hypernatremia (transitioned to LR). Blood cultures pending, broad spectrum vanc/cefepime initiated.   PCCM consulted for admission.  Pertinent Medical History:  No past medical history on file.  Significant Hospital Events: Including procedures, antibiotic start and stop dates in addition to other pertinent events   9/13 - Found down at home by neighbor, last seen 2 days PTA. To [facility] ED for evaluation. HyperNa, hyperglycemic, elevated CK, elevated AST/ALT; UDS negative. Multiple thomboembolic issues noted on imaging (as above). VVS consulted for ischemic LLE. 9/14 VANC/CEFE> Ancef, BC>MSSA  Interim History / Subjective:  More awake today,  following simple commands. Legs remain ischemic. Denies pain.  Objective:  Blood pressure 91/66, pulse 93, temperature 98.2 F (36.8 C), temperature source Axillary, resp. rate (!) 8, height 5\' 11"  (1.803 m), weight 54.8 kg, SpO2 99 %.        Intake/Output Summary (Last 24 hours) at 06/13/2022 0740 Last data filed at 06/13/2022 0600 Gross per 24 hour  Intake 2335.98 ml  Output 300 ml  Net 2035.98 ml    Filed Weights   July 09, 2022 1523 06/12/22 0400 06/13/22 0500  Weight: 54.4 kg 51.3 kg 54.8 kg   Physical Examination: Chronically ill appearing man laying in bed Able to move upper ext to command Very weak Ext lukewarm Irregular heart rhythm Severe muscle wasting Ischemic changes stable as per media tab  CBG ok WNC stable Plts stable Sodium improved with hydration  Resolved Hospital Problem List:    Assessment & Plan:  Acute metabolic encephalopathy Catastrophic poly-embolic state (lung, bilateral femoral, spleen, ?brain, heart, kidneys) in setting of MSSA bacteremia; question if clot/septic or both Stress vs. Ischemic cardiomyopathy Acute rhabdomyolysis Nonviable LLE, possible nonviable RLE Profound protein calorie malnutrition Severe muscular deconditioning Likely refeeding syndrome 7 mm solid nodule in the right upper lobe  - Continue heparin drip, TF, FWF, and cefazolin - MRI brain for prognostic purposes - Was able to reach niece but she was not willing to discuss EOL, will have palliative reach out - If aggressive care is pursued, patient will need (A) L AKA, (B) R high risk thrombectomy with probable R AKA as not sure it will work, SNF placement.  Hope is that patients mental status will continue to improve with correction of metabolic derangements so he can participate in this decision.  If he cannot  and niece is not willing to participate, I would advocate for 2 physician DNR as chest compressions/shocks will not meaningfully prolong this man's life and are  borderline futile.  PMT to reach out again. - Eventual GDMT for cardiomyopathy  Best Practice: (right click and "Reselect all SmartList Selections" daily)   Diet/type: TF DVT prophylaxis: systemic heparin GI prophylaxis: PPI Lines: N/A Foley:  N/A Code Status:  full code Last date of multidisciplinary goals of care discussion [see above]  Critical care time: 42 minutes  Myrla Halsted MD PCCM [facility]  06/13/2022 , 7:40 AM  Please see [email or web address] for pager details  If no response, please call [phone] After hours, please call Elink at [phone]

## 2022-06-13 NOTE — Progress Notes (Signed)
  Progress Note    06/13/2022 8:13 AM * No surgery found *  Subjective:  Pt altered.  Moves arms to command.  Unable to answer any questions.    Vitals:   06/13/22 0602 06/13/22 0700  BP:  91/66  Pulse:  93  Resp:  (!) 8  Temp:    SpO2: 100% 99%    Physical Exam: Lungs:  nonlabored Extremities:  LLE with wounds and cold. RLE is cool to the touch Left lower extremity wounds dry, underlying tissue ischemic No signals in the feet   CBC    Component Value Date/Time   WBC 9.0 06/13/2022 0719   RBC 4.24 06/13/2022 0719   HGB 12.5 (L) 06/13/2022 0719   HGB 14.2 11/24/2012 1401   HCT 40.5 06/13/2022 0719   HCT 42.8 11/24/2012 1401   PLT 169 06/13/2022 0719   PLT 279 11/24/2012 1401   MCV 95.5 06/13/2022 0719   MCV 89 11/24/2012 1401   MCH 29.5 06/13/2022 0719   MCHC 30.9 06/13/2022 0719   RDW 13.5 06/13/2022 0719   RDW 14.9 (H) 11/24/2012 1401   LYMPHSABS 0.7 06/16/2022 1108   MONOABS 0.4 06/07/2022 1108   EOSABS 0.0 Jun 15, 2022 1108   BASOSABS 0.0 June 15, 2022 1108    BMET    Component Value Date/Time   NA 153 (H) 06/13/2022 0158   NA 142 11/24/2012 1401   K 3.2 (L) 06/12/2022 0412   K 3.7 11/24/2012 1401   CL 121 (H) 06/12/2022 0412   CL 107 11/24/2012 1401   CO2 32 06/12/2022 0412   CO2 28 11/24/2012 1401   GLUCOSE 87 06/12/2022 0412   GLUCOSE 131 (H) 11/24/2012 1401   BUN 8 06/12/2022 0412   BUN 16 11/24/2012 1401   CREATININE 0.74 06/12/2022 0412   CREATININE 1.02 11/24/2012 1401   CALCIUM 8.4 (L) 06/12/2022 0412   CALCIUM 9.3 11/24/2012 1401   GFRNONAA >60 06/12/2022 0412    INR    Component Value Date/Time   INR 1.3 (H) 06/17/2022 1548     Intake/Output Summary (Last 24 hours) at 06/13/2022 0813 Last data filed at 06/13/2022 0600 Gross per 24 hour  Intake 2184.36 ml  Output 300 ml  Net 1884.36 ml      Assessment/Plan:  66 y.o. male with ischemic wounds bilaterally from pan-embolic event.  Currently with altered mental status,  pulmonary embolus, staph epi bacteremia, EF 20% global hypokinesia, significant electrolyte abnormalities, bilateral lower extremity ischemia     -No change in mental status or exam. Patient is not medically stable for intervention. Would likely require bilateral AKAs -Echo yesterday revealed no sign of vegetations.  Patient not a candidate for cardiac cath and current clinical condition.  EF 20%.  Cardiology recommended amputation under regional block if necessary.  Patient is not a right lower extremity revascularization candidate at this time as intervention would likely result in intraoperative mortality.    We will continue to follow.  Recommend continuing heparin drip.  Appreciate ICU reaching out to family.  Family unable to provide any insight into patient's wishes.  Possible two-physician DNR by days end as niece stated she was not able to help in any decision making.    Likely bilateral above-knee amputations should the patient survive current state.  Victorino Sparrow [facility] [phone] 06/13/2022 8:13 AM

## 2022-06-13 NOTE — Consult Note (Addendum)
NEUROLOGY CONSULTATION NOTE   Date of service: June 13, 2022 Patient Name: Alexander Greene MRN:  [id number] DOB:  [birth year] Reason for consult: "embolic infarcts" Requesting Provider: Lorin Glass, MD _ _ _   _ __   _ __ _ _  __ __   _ __   __ _  History of Present Illness  Alexander Greene is a 66 y.o. male with unknown hx who was found unresponsive by neighbor. LKW 2 days ago. He was found to be hypernatremic, hypothermic, rhabdo with AKI. Imaging demonstrated PE, suspicion for LV thrombus with infarcts in multiple organs. He had MRI brain which demonstrated acute embolic appearing BL hemispheric infarcts, most prominent in BL occipital lobes. He has been started on heparin gtt.  LKW: 06/09/22 mRS: unknown. tNKASE: not offered, outside window Thrombectomy: not offered, outside window. NIHSS components Score: Comment  1a Level of Conscious 0[]  1[x]  2[]  3[]      1b LOC Questions 0[]  1[]  2[x]       1c LOC Commands 0[]  1[x]  2[]       2 Best Gaze 0[x]  1[]  2[]       3 Visual 0[]  1[]  2[]  3[x]      4 Facial Palsy 0[x]  1[]  2[]  3[]      5a Motor Arm - left 0[x]  1[]  2[]  3[]  4[]  UN[]    5b Motor Arm - Right 0[x]  1[]  2[]  3[]  4[]  UN[]    6a Motor Leg - Left 0[]  1[]  2[]  3[x]  4[]  UN[]    6b Motor Leg - Right 0[]  1[]  2[]  3[x]  4[]  UN[]    7 Limb Ataxia 0[x]  1[]  2[]  3[]  UN[]     8 Sensory 0[]  1[]  2[x]  UN[]      9 Best Language 0[]  1[x]  2[]  3[]      10 Dysarthria 0[]  1[x]  2[]  UN[]      11 Extinct. and Inattention 0[x]  1[]  2[]       TOTAL: 17       ROS   Unable to get detailed review of system due to somnolence.  Past History  No past medical history on file. No past surgical history on file. No family history on file. Social History   Socioeconomic History   Marital status: Single    Spouse name: Not on file   Number of children: Not on file   Years of education: Not on file   Highest education level: Not on file  Occupational History   Not on file  Tobacco Use   Smoking status: Not  on file   Smokeless tobacco: Not on file  Substance and Sexual Activity   Alcohol use: Not on file   Drug use: Not on file   Sexual activity: Not on file  Other Topics Concern   Not on file  Social History Narrative   Not on file   Social Determinants of Health   Financial Resource Strain: Not on file  Food Insecurity: Not on file  Transportation Needs: Not on file  Physical Activity: Not on file  Stress: Not on file  Social Connections: Not on file   No Known Allergies  Medications   Medications Prior to Admission  Medication Sig Dispense Refill Last Dose   methocarbamol (ROBAXIN) 500 MG tablet Take 1 tablet (500 mg total) by mouth 2 (two) times daily. 20 tablet 0      Vitals   Vitals:   06/13/22 1500 06/13/22 1600 06/13/22 1700 06/13/22 1800  BP: 109/72 97/72 95/67  93/71  Pulse: (!) 104 (!) 105 (!) 108 (!) 106  Resp:  16 (!) 24 17 19   Temp:      TempSrc:      SpO2: 95% 100% 100% 96%  Weight:      Height:         Body mass index is 16.85 kg/m.  Physical Exam   General: Laying comfortably in bed; in no acute distress.  HENT: Normal oropharynx and mucosa. Normal external appearance of ears and nose.  Neck: Supple, no pain or tenderness  CV: No JVD. No peripheral edema.  Pulmonary: Symmetric Chest rise. Normal respiratory effort.  Abdomen: Soft to touch, non-tender.  Ext: BL lower extremities are cold to touch, ischemic wounds on lle. no edema, or deformity Skin: No rash. Normal palpation of skin.   Musculoskeletal: Normal digits and nails by inspection. No clubbing.   Neurologic Examination  Mental status/Cognition: Alert, oriented to self, but no to place, month and year, poor attention. Speech/language: mildly dysarthric speech, non fluent, comprehension intact to some simple commands, names his thumb. Cranial nerves:   CN II Pupils equal and reactive to light, doe snot blink to threat BL. However, endorses no vision loss.   CN III,IV,VI EOM intact, no  gaze preference or deviation, no nystagmus   CN V normal sensation in V1, V2, and V3 segments bilaterally   CN VII no asymmetry, no nasolabial fold flattening   CN VIII Turns head towards speech   CN IX & X normal palatal elevation, no uvular deviation   CN XI Head mildine.   CN XII midline tongue.   Motor:  Muscle bulk: poor, tone normal. Mvmt Root Nerve  Muscle Right Left Comments  SA C5/6 Ax Deltoid 4+ 4+   EF C5/6 Mc Biceps 4+ 4+   EE C6/7/8 Rad Triceps     WF C6/7 Med FCR     WE C7/8 PIN ECU     F Ab C8/T1 U ADM/FDI 4+ 4+   HF L1/2/3 Fem Illopsoas 1 1   KE L2/3/4 Fem Quad 0 0   DF L4/5 D Peron Tib Ant 0 0   PF S1/2 Tibial Grc/Sol 0 0    Sensation:  Light touch Absent in BL legs.   Pin prick    Temperature    Vibration   Proprioception    Coordination/Complex Motor:  - Finger to Nose intact BL - Heel to shin unable to do - Rapid alternating movement unable to do - Gait: Deferred for patient safety.  Labs   CBC:  Recent Labs  Lab Jul 05, 2022 1108 07/05/22 1139 06/12/22 0412 06/13/22 0719  WBC 10.3  --  11.7* 9.0  NEUTROABS 9.0*  --   --   --   HGB 15.8   < > 14.0 12.5*  HCT 52.8*   < > 45.6 40.5  MCV 101.1*  --  97.2 95.5  PLT 195   < > 176 169   < > = values in this interval not displayed.    Basic Metabolic Panel:  Lab Results  Component Value Date   NA 152 (H) 06/13/2022   K 2.8 (L) 06/13/2022   CO2 29 06/13/2022   GLUCOSE 137 (H) 06/13/2022   BUN 10 06/13/2022   CREATININE 0.73 06/13/2022   CALCIUM 7.8 (L) 06/13/2022   GFRNONAA >60 06/13/2022   Lipid Panel: No results found for: "LDLCALC" HgbA1c:  Lab Results  Component Value Date   HGBA1C 12.6 (H) 06/12/2022   Urine Drug Screen:     Component Value Date/Time   LABOPIA NONE DETECTED  06/27/2022 1450   COCAINSCRNUR NONE DETECTED 06/16/2022 1450   LABBENZ NONE DETECTED 05/30/2022 1450   AMPHETMU NONE DETECTED 06/05/2022 1450   THCU NONE DETECTED 06/28/2022 1450   LABBARB NONE DETECTED  06/01/2022 1450    Alcohol Level     Component Value Date/Time   ETH <10 05/30/2022 1105    CT Head without contrast(Personally reviewed): CTH was negative for a large hypodensity concerning for a large territory infarct or hyperdensity concerning for an ICH  MR Angio head without contrast and Carotid Duplex BL: pending  MRI Brain(Personally reviewed): BL occipital infarcts along with other small hemispheric infarcts.   Impression   TIA MAGA is a 66 y.o. male with unknown hx who was found unresponsive by neighbor. LKW 2 days ago. He was found to be hypernatremic, hypothermic, rhabdo with AKI. Imaging demonstrated PE, suspicion for LV thrombus with infarcts in multiple organs. He had MRI brain which demonstrated acute embolic appearing BL hemispheric infarcts, most prominent in BL occipital lobes. 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 - Recommend obtaining Lipid panel with LDL - Please start statin if LDL > 70 - Recommend HbA1c - Clear for Anticoagulation from a Neuro standpoint given significant clot burden and low EF. - Recommend DVT ppx - SBP goal - gradual normotension. - Recommend Telemetry monitoring for arrythmia - Recommend bedside swallow screen prior to PO intake. - Stroke education booklet - Recommend PT/OT/SLP consult  ______________________________________________________________________  This  patient is critically ill and at significant risk of neurological worsening, death and care requires constant monitoring of vital signs, hemodynamics,respiratory and cardiac monitoring, neurological assessment, discussion with family, other specialists and medical decision making of high complexity. I spent 40 minutes of neurocritical care time  in the care of  this patient. This was time spent independent of any time provided by nurse practitioner or PA.  Donnetta Simpers [facility] Pager Number [phone] 06/13/2022  9:57 PM   Thank you for the opportunity to take part in the care of this patient. If you have any further questions, please contact the neurology consultation attending.  Signed,  Highland Holiday Pager Number [phone] _ _ _   _ __   _ __ _ _  __ __   _ __   __ _

## 2022-06-13 NOTE — Progress Notes (Addendum)
Progress Note  Patient Name: Alexander Greene Date of Encounter: 06/13/2022  Primary Cardiologist: None  Subjective   Awake, alert. Remembered his name after a few moments. Laughs when asked questions and looks around. Knows it's 2023. Cannot say where he is.   Inpatient Medications    Scheduled Meds:  Chlorhexidine Gluconate Cloth  6 each Topical q morning   free water  200 mL Per Tube Q6H   insulin aspart  2-6 Units Subcutaneous Q4H   multivitamin with minerals  1 tablet Per Tube Daily   mouth rinse  15 mL Mouth Rinse 4 times per day   pantoprazole  40 mg Per Tube Daily   thiamine  100 mg Per Tube Daily   Continuous Infusions:   ceFAZolin (ANCEF) IV Stopped (06/13/22 0536)   feeding supplement (VITAL AF 1.2 CAL) 30 mL/hr at 06/13/22 0900   heparin 1,000 Units/hr (06/13/22 0900)   PRN Meds: acetaminophen (TYLENOL) oral liquid 160 mg/5 mL, docusate sodium, HYDROmorphone (DILAUDID) injection, mouth rinse, polyethylene glycol   Vital Signs    Vitals:   06/13/22 0602 06/13/22 0700 06/13/22 0800 06/13/22 0900  BP:  91/66 101/75 108/83  Pulse:  93 93 100  Resp:  (!) 8 14 14   Temp:   97.6 F (36.4 C)   TempSrc:   Axillary   SpO2: 100% 99% 96% 100%  Weight:      Height:        Intake/Output Summary (Last 24 hours) at 06/13/2022 1002 Last data filed at 06/13/2022 0900 Gross per 24 hour  Intake 1965.5 ml  Output 300 ml  Net 1665.5 ml      06/13/2022    5:00 AM 06/12/2022    4:00 AM 06/13/2022    3:23 PM  Last 3 Weights  Weight (lbs) 120 lb 13 oz 113 lb 1.5 oz 119 lb 14.9 oz  Weight (kg) 54.8 kg 51.3 kg 54.4 kg     Telemetry    NSR/ST, occ PVCs/couplets/triplets - Personally Reviewed  Physical Exam   GEN: No acute distress. Cachectic appearing HEENT: Normocephalic, atraumatic, sclera non-icteric. Poor dentition Neck: No JVD or bruits. Cardiac: RRR slightly elevated rate, occasional ectopy, no murmurs, rubs, or gallops.  Respiratory: Coarse but to  auscultation bilaterally anteriorly. Breathing is unlabored. GI: Soft, nontender, non-distended, BS +x 4. MS: no deformity. Extremities: No clubbing or cyanosis. No edema. Distal pedal pulses are 2+ and equal bilaterally. Neuro:  Delayed cognition - knows name,year but not place after several minutes. Follows ismple commands Psych:  Calm affect. Occasionally laughs  Labs    High Sensitivity Troponin:   Recent Labs  Lab 06/12/2022 1312 06/08/2022 1517 06/12/22 0628 06/13/22 0719  TROPONINIHS 596* 690* 1,345* 640*      Cardiac EnzymesNo results for input(s): "TROPONINI" in the last 168 hours. No results for input(s): "TROPIPOC" in the last 168 hours.   Chemistry Recent Labs  Lab 06/05/2022 1108 06/16/2022 1139 06/12/22 0013 06/12/22 0412 06/12/22 0628 06/12/22 2223 06/13/22 0158 06/13/22 0719  NA 164*   < > 162* 163*   < > 155* 153* 152*  K 3.6   < > 2.6* 3.2*  --   --   --  2.8*  CL 126*   < > 126* 121*  --   --   --  114*  CO2 26   < > 30 32  --   --   --  29  GLUCOSE 327*   < > 195* 87  --   --   --  137*  BUN 14   < > 7* 8  --   --   --  10  CREATININE 0.80   < > 0.86 0.74  --   --   --  0.73  CALCIUM 8.5*   < > 8.0* 8.4*  --   --   --  7.8*  PROT 6.9  --   --   --   --   --   --   --   ALBUMIN 2.4*  --   --   --   --   --   --   --   AST 77*  --   --   --   --   --   --   --   ALT 87*  --   --   --   --   --   --   --   ALKPHOS 124  --   --   --   --   --   --   --   BILITOT 1.9*  --   --   --   --   --   --   --   GFRNONAA >60   < > >60 >60  --   --   --  >60  ANIONGAP 12   < > 6 10  --   --   --  9   < > = values in this interval not displayed.     Hematology Recent Labs  Lab 06/08/2022 1108 06/18/2022 1139 06/01/2022 1517 06/16/2022 1548 06/12/22 0412 06/13/22 0719  WBC 10.3  --   --   --  11.7* 9.0  RBC 5.22  --   --   --  4.69 4.24  HGB 15.8   < > 14.3  --  14.0 12.5*  HCT 52.8*   < > 42.0  --  45.6 40.5  MCV 101.1*  --   --   --  97.2 95.5  MCH 30.3  --   --    --  29.9 29.5  MCHC 29.9*  --   --   --  30.7 30.9  RDW 13.8  --   --   --  13.7 13.5  PLT 195  --   --  186 176 169   < > = values in this interval not displayed.    BNPNo results for input(s): "BNP", "PROBNP" in the last 168 hours.   DDimer  Recent Labs  Lab 06/10/2022 1548  DDIMER 11.34*     Radiology    ECHOCARDIOGRAM COMPLETE  Result Date: 06/12/2022    ECHOCARDIOGRAM REPORT   Patient Name:   Alexander Greene Date of Exam: 06/12/2022 Medical Rec #:  [id number]          Height:       71.0 in Accession #:    [id number]         Weight:       113.1 lb Date of Birth:  Feb 03, 1956          BSA:          1.655 m Patient Age:    66 years           BP:           105/76 mmHg Patient Gender: M                  HR:           96 bpm.  Exam Location:  Inpatient Procedure: 2D Echo, Cardiac Doppler, Color Doppler, Intracardiac Opacification            Agent and Saline Contrast Bubble Study Indications:    Pulmonary embolus  History:        Patient has no prior history of Echocardiogram examinations.  Sonographer:    Memory Argue Referring Phys: [place]  1. No mural thrombus with Definity contrast. Left ventricular ejection fraction, by estimation, is 20 to 25%. Left ventricular ejection fraction by 2D MOD biplane is 20.7 %. The left ventricle has severely decreased function. The left ventricle demonstrates global hypokinesis. There is moderate left ventricular hypertrophy. Left ventricular diastolic parameters are indeterminate.  2. Right ventricular systolic function is mildly reduced. The right ventricular size is normal. Tricuspid regurgitation signal is inadequate for assessing PA pressure.  3. Left atrial size was severely dilated.  4. The mitral valve is abnormal. Trivial mitral valve regurgitation.  5. The aortic valve is tricuspid. Aortic valve regurgitation is not visualized.  6. Aortic dilatation noted. There is borderline dilatation of the aortic root, measuring 38 mm.   7. The inferior vena cava is normal in size with <50% respiratory variability, suggesting right atrial pressure of 8 mmHg.  8. Agitated saline contrast bubble study was negative, with no evidence of any interatrial shunt. Comparison(s): No prior Echocardiogram. FINDINGS  Left Ventricle: No mural thrombus with Definity contrast. Left ventricular ejection fraction, by estimation, is 20 to 25%. Left ventricular ejection fraction by 2D MOD biplane is 20.7 %. The left ventricle has severely decreased function. The left ventricle demonstrates global hypokinesis. The left ventricular internal cavity size was normal in size. There is moderate left ventricular hypertrophy. Left ventricular diastolic parameters are indeterminate. Right Ventricle: The right ventricular size is normal. No increase in right ventricular wall thickness. Right ventricular systolic function is mildly reduced. Tricuspid regurgitation signal is inadequate for assessing PA pressure. Left Atrium: Left atrial size was severely dilated. Right Atrium: Right atrial size was normal in size. Pericardium: There is no evidence of pericardial effusion. Mitral Valve: The mitral valve is abnormal. There is moderate thickening of the anterior and posterior mitral valve leaflet(s). Trivial mitral valve regurgitation. Tricuspid Valve: The tricuspid valve is grossly normal. Tricuspid valve regurgitation is trivial. Aortic Valve: The aortic valve is tricuspid. Aortic valve regurgitation is not visualized. Aortic valve mean gradient measures 4.0 mmHg. Aortic valve peak gradient measures 7.3 mmHg. Aortic valve area, by VTI measures 1.84 cm. Pulmonic Valve: The pulmonic valve was normal in structure. Pulmonic valve regurgitation is not visualized. Aorta: Aortic dilatation noted. There is borderline dilatation of the aortic root, measuring 38 mm. Venous: The inferior vena cava is normal in size with less than 50% respiratory variability, suggesting right atrial pressure  of 8 mmHg. IAS/Shunts: There is right bowing of the interatrial septum, suggestive of elevated left atrial pressure. No atrial level shunt detected by color flow Doppler. Agitated saline contrast was given intravenously to evaluate for intracardiac shunting. Agitated saline contrast bubble study was negative, with no evidence of any interatrial shunt.  LEFT VENTRICLE PLAX 2D                        Biplane EF (MOD) LVIDd:         5.50 cm         LV Biplane EF:   Left LVIDs:         5.00 cm  ventricular LV PW:         1.30 cm                          ejection LV IVS:        1.30 cm                          fraction by LVOT diam:     2.20 cm                          2D MOD LV SV:         40                               biplane is LV SV Index:   24                               20.7 %. LVOT Area:     3.80 cm                                Diastology                                LV e' medial:    7.18 cm/s LV Volumes (MOD)               LV E/e' medial:  12.6 LV vol d, MOD    157.0 ml      LV e' lateral:   5.44 cm/s A2C:                           LV E/e' lateral: 16.6 LV vol d, MOD    182.0 ml A4C: LV vol s, MOD    130.0 ml A2C: LV vol s, MOD    131.0 ml A4C: LV SV MOD A2C:   27.0 ml LV SV MOD A4C:   182.0 ml LV SV MOD BP:    35.3 ml RIGHT VENTRICLE TAPSE (M-mode): 1.8 cm LEFT ATRIUM              Index        RIGHT ATRIUM           Index LA Vol (A2C):   88.5 ml  53.48 ml/m  RA Area:     13.40 cm LA Vol (A4C):   111.0 ml 67.07 ml/m  RA Volume:   33.00 ml  19.94 ml/m LA Biplane Vol: 100.0 ml 60.43 ml/m  AORTIC VALVE AV Area (Vmax):    2.06 cm AV Area (Vmean):   2.02 cm AV Area (VTI):     1.84 cm AV Vmax:           135.00 cm/s AV Vmean:          97.900 cm/s AV VTI:            0.219 m AV Peak Grad:      7.3 mmHg AV Mean Grad:      4.0 mmHg LVOT Vmax:         73.10 cm/s LVOT Vmean:        52.000 cm/s  LVOT VTI:          0.106 m LVOT/AV VTI ratio: 0.48  AORTA Ao Root diam: 3.65 cm Ao Asc diam:   3.20 cm MITRAL VALVE MV Area (PHT): 5.34 cm    SHUNTS MV Decel Time: 142 msec    Systemic VTI:  0.11 m MV E velocity: 90.40 cm/s  Systemic Diam: 2.20 cm MV A velocity: 80.10 cm/s MV E/A ratio:  1.13 Lyman Bishop MD Electronically signed by Lyman Bishop MD Signature Date/Time: 06/12/2022/1:14:27 PM    Final    DG Abd Portable 1V  Result Date: 06/12/2022 CLINICAL DATA:  Altered mental status EXAM: PORTABLE ABDOMEN - 1 VIEW COMPARISON:  None Available. FINDINGS: Enteric tube passes into the stomach with tip overlying the pyloric region. Bowel gas pattern is unremarkable. IMPRESSION: Enteric tube within the distal stomach. Electronically Signed   By: Macy Mis M.D.   On: 06/12/2022 10:36   DG Chest Port 1 View  Result Date: 06/12/2022 CLINICAL DATA:  Shortness of breath.  Metabolic encephalopathy. EXAM: PORTABLE CHEST 1 VIEW COMPARISON:  Yesterday FINDINGS: Numerous leads and wires project over the chest. Midline trachea. Borderline cardiomegaly. No pleural effusion or pneumothorax. Clear lungs. IMPRESSION: Borderline cardiomegaly, without acute disease. Electronically Signed   By: Abigail Miyamoto M.D.   On: 06/12/2022 08:22   CT CHEST ABDOMEN PELVIS W CONTRAST  Result Date: 06/18/2022 CLINICAL DATA:  A 66 year old male presents with history of sepsis. EXAM: CT CHEST, ABDOMEN, AND PELVIS WITH CONTRAST TECHNIQUE: Multidetector CT imaging of the chest, abdomen and pelvis was performed following the standard protocol during bolus administration of intravenous contrast. RADIATION DOSE REDUCTION: This exam was performed according to the departmental dose-optimization program which includes automated exposure control, adjustment of the mA and/or kV according to patient size and/or use of iterative reconstruction technique. CONTRAST:  59mL OMNIPAQUE IOHEXOL 350 MG/ML SOLN COMPARISON:  Cervical spine evaluation performed the same date. FINDINGS: CT CHEST FINDINGS Cardiovascular: Low-attenuation about papillary  muscles near the LEFT ventricular apex raising the question of small amounts of thrombus adherent to papillary musculature in the LEFT ventricle. LEFT ventricular dilation. No LEFT atrial thrombus. Signs of RIGHT lower lobe pulmonary artery emboli in basilar segmental level branches (image 37/3) Central pulmonary vasculature is normal caliber. The aorta is normal caliber with smooth contour. Mediastinum/Nodes: No signs of adenopathy in the chest. Esophagus is grossly normal. Lungs/Pleura: No lobar consolidation. RIGHT upper lobe pulmonary nodule (image 35/5) 8 x 6 mm. Airways are patent. Small bilateral pleural effusions are present layering dependently in the chest. No pneumothorax. Musculoskeletal: See below for full musculoskeletal details. CT ABDOMEN PELVIS FINDINGS Hepatobiliary: Hepatic steatosis. Smooth hepatic contours. Patent portal vein. No suspicious hepatic lesion. No acute biliary process. Pancreas: Mild atrophy of the pancreas without ductal dilation, inflammation or visible lesion. 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 Adrenals/Urinary Tract: Adrenal glands are normal. 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 Heterogeneity in general of the nephrogram a stray shin seen bilaterally. Striated nephrogram can also be seen in the setting of pyelonephritis but there is at least 1 area that is strongly suggestive of if not diagnostic for renal infarct. Other areas also show focal characteristics either infarct or focal nephritis. Renal veins are patent. Stomach/Bowel: No stranding adjacent to the stomach. No small bowel obstruction. Mild generalize stranding  in the mesentery of the small bowel, of uncertain significance in the setting of small bilateral effusions and body wall edema. This may be developing generalized  anasarca. 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 Chronicity uncertain, potentially acute/recent though with reconstitution of flow seen in superficial and deep femoral arteries distal to the site of occlusion in the upper thigh. Also however with occluded superficial femoral artery on the LEFT (image 117/3) No signs of adenopathy in the abdomen or the pelvis. Remaining branches of the abdominal aorta grossly patent this venous phase assessment. Reproductive: Prostate mild to moderately enlarged. Urinary bladder without focal thickening or gross perivesical stranding. Other: Body wall edema.  No ascites.  No pneumoperitoneum. Musculoskeletal: No acute bone finding. No destructive bone process. Spinal degenerative changes. IMPRESSION: 1. RIGHT lower lobe pulmonary artery emboli in basilar segmental level branches. 2. 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 Given focal appearance of some of these areas would suggest 3 to six-month follow-up to ensure no underlying lesion is present. 3. 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 Findings are suspicious for recent and or acute occlusion superimposed on chronic vascular disease. 4. 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 Vascular surgery consultation is suggested. 5. 7 mm right solid pulmonary nodule within the upper lobe. Per Fleischner Society Guidelines, recommend a non-contrast Chest CT at 6-12 months. If patient is high risk for malignancy, recommend an  additional non-contrast Chest CT at 18-24 months; if patient is low risk for malignancy a non-contrast Chest CT at 18-24 months is optional. These guidelines do not apply to immunocompromised patients and patients with cancer. Follow up in patients with significant comorbidities as clinically warranted. For lung cancer screening, adhere to Lung-RADS guidelines. Reference: Radiology. 2017; 284(1):228-43. Critical Value/emergent results were called by telephone at the time of interpretation on 06/17/2022 at 3:01 pm to provider [facility] , who verbally acknowledged these results. Electronically Signed   By: Zetta Bills M.D.   On: 06/10/2022 15:01   CT Head Wo Contrast  Result Date: 06/21/2022 CLINICAL DATA:  Altered mental status, neck injury. EXAM: CT HEAD WITHOUT CONTRAST CT CERVICAL SPINE WITHOUT CONTRAST TECHNIQUE: Multidetector CT imaging of the head and cervical spine was performed following the standard protocol without intravenous contrast. Multiplanar CT image reconstructions of the cervical spine were also generated. Unfortunately, exam is limited due to patient motion artifact. RADIATION DOSE REDUCTION: This exam was performed according to the departmental dose-optimization program which includes automated exposure control, adjustment of the mA and/or kV according to patient size and/or use of iterative reconstruction technique. COMPARISON:  April 05, 2021. FINDINGS: CT HEAD FINDINGS Brain: No evidence of acute infarction, hemorrhage, hydrocephalus, extra-axial collection or mass lesion/mass effect. Vascular: No hyperdense vessel or unexpected calcification. Skull: Normal. Negative for fracture or focal lesion. Sinuses/Orbits: No acute finding. Other: None. CT CERVICAL SPINE FINDINGS Alignment: Normal. Skull base and vertebrae: Only the first 6 cervical vertebra are well visualized due to patient motion artifact. No fracture or bony abnormality is involving these. Pathology involving C7 cannot be  excluded on the  basis of this exam. Soft tissues and spinal canal: No prevertebral fluid or swelling seen in visualized areas. No visible canal hematoma. Disc levels:  Moderate degenerative disc disease is noted at C3-4. Upper chest: Not visualized due to patient motion artifact. Other: None. IMPRESSION: No definite acute intracranial abnormality seen. Due to patient motion artifact, only the first 6 of cervical vertebra are well visualized. No fracture or spondylolisthesis is seen involving these visualized vertebra. Fracture or other pathology involving C7 vertebra cannot be excluded on the basis of this exam. Electronically Signed   By: Marijo Conception M.D.   On: 06/05/2022 13:21   CT Cervical Spine Wo Contrast  Result Date: 06/09/2022 CLINICAL DATA:  Altered mental status, neck injury. EXAM: CT HEAD WITHOUT CONTRAST CT CERVICAL SPINE WITHOUT CONTRAST TECHNIQUE: Multidetector CT imaging of the head and cervical spine was performed following the standard protocol without intravenous contrast. Multiplanar CT image reconstructions of the cervical spine were also generated. Unfortunately, exam is limited due to patient motion artifact. RADIATION DOSE REDUCTION: This exam was performed according to the departmental dose-optimization program which includes automated exposure control, adjustment of the mA and/or kV according to patient size and/or use of iterative reconstruction technique. COMPARISON:  April 05, 2021. FINDINGS: CT HEAD FINDINGS Brain: No evidence of acute infarction, hemorrhage, hydrocephalus, extra-axial collection or mass lesion/mass effect. Vascular: No hyperdense vessel or unexpected calcification. Skull: Normal. Negative for fracture or focal lesion. Sinuses/Orbits: No acute finding. Other: None. CT CERVICAL SPINE FINDINGS Alignment: Normal. Skull base and vertebrae: Only the first 6 cervical vertebra are well visualized due to patient motion artifact. No fracture or bony abnormality is involving  these. Pathology involving C7 cannot be excluded on the basis of this exam. Soft tissues and spinal canal: No prevertebral fluid or swelling seen in visualized areas. No visible canal hematoma. Disc levels:  Moderate degenerative disc disease is noted at C3-4. Upper chest: Not visualized due to patient motion artifact. Other: None. IMPRESSION: No definite acute intracranial abnormality seen. Due to patient motion artifact, only the first 6 of cervical vertebra are well visualized. No fracture or spondylolisthesis is seen involving these visualized vertebra. Fracture or other pathology involving C7 vertebra cannot be excluded on the basis of this exam. Electronically Signed   By: Marijo Conception M.D.   On: 06/20/2022 13:21   DG Chest Port 1 View  Result Date: 06/16/2022 : Questionable sepsis - evaluate for abnormality EXAM: PORTABLE CHEST 1 VIEW COMPARISON:  Chest x-ray 04/05/2021. FINDINGS: Mild linear right basilar opacity. No confluent consolidation. No visible pleural effusions pneumothorax pre cardiomediastinal silhouette is within normal limits when accounting for technique soft were taken. IMPRESSION: Mild linear right basilar opacity, favor subsegmental atelectasis. No confluent consolidation. Electronically Signed   By: Margaretha Sheffield M.D.   On: 06/03/2022 11:41    Cardiac Studies   2d echo 06/12/22.  1. No mural thrombus with Definity contrast. Left ventricular ejection  fraction, by estimation, is 20 to 25%. Left ventricular ejection fraction  by 2D MOD biplane is 20.7 %. The left ventricle has severely decreased  function. The left ventricle  demonstrates global hypokinesis. There is moderate left ventricular  hypertrophy. Left ventricular diastolic parameters are indeterminate.   2. Right ventricular systolic function is mildly reduced. The right  ventricular size is normal. Tricuspid regurgitation signal is inadequate  for assessing PA pressure.   3. Left atrial size was severely  dilated.   4. The mitral valve is abnormal. Trivial mitral valve  regurgitation.   5. The aortic valve is tricuspid. Aortic valve regurgitation is not  visualized.   6. Aortic dilatation noted. There is borderline dilatation of the aortic  root, measuring 38 mm.   7. The inferior vena cava is normal in size with <50% respiratory  variability, suggesting right atrial pressure of 8 mmHg.   8. Agitated saline contrast bubble study was negative, with no evidence  of any interatrial shunt.   Comparison(s): No prior Echocardiogram.   Patient Profile     66 y.o. male with unknown medical history brought in unresponsive by a neighbor found to have multiple medical derangements. He was hypothermic, cachectic, severely hypernatremic at 164. 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 Also with elevated CK but preserved renal function. Felt to require bilateral AKAs. Cardiology consulted for elevated troponin (peak 1345) and abnormal echo - EF 20-25%, global HK, severe LAE, borderline dilation of aortic root, negative bubble study, no LV thrombus.   Assessment & Plan    1. Acute encephalopathy with multiple medical issues - hypernatremia, hyperglycemia, hypokalemia, elevated CK, elevated AST/ALT, UDS negative, multiple thromboembolic issues, ischemic wounds bilaterally, MSSA bacteremia - remains confused but more conversant than described in previous notes - per notes, palliative care consulted, only point of contact has been niece for whom notes is unwilling to decide EOL - electrolyte management per primary team - per vasc surgery, would likely require bilateral AKAs if becomes stable  for intervention - per Dr. Leonides Sake note, would be high risk for surgery though this is fairly low risk procedure and would consider regional anesthesia to avoid intubation if possible - MRI brain planned for prognostic purposes  2. Systolic heart failure, chronicity unknown, with elevated troponin - dry and remains hypernatremic and hypokalemic - not currently a candidate for cardiac cath in current condition, patient also not consentable with ongoing confusion as well - follow for clinical improvement  3. Multiple arterial emboli - CT showed RLL PE, end organ infarcts in spleen and kidney, occlusion of CIAs, incidental pickup of RUL pulm nodule which will need attention on follow-up if clinically appropriate - CT also showed possible LV thrombus but 2D echo did not suggest this was the case, await MD input on this - on heparin for now - 2D echo bubble study negative but considering TEE to eval for PFO if he becomes more stable, also has MSSA bacteremia per PCCM notes  4. Rhabdomyolysis - CK elevated but Cr has bene preserved - avoid statin for now  5. PVCs - occasional isolated PVCs, pairs, couplets noted - recommend keeping K 4.0 or greater and Mg 2.0 or greater but will defer lyte management to primary team given derangement in labs  6. Cachexia - poor dentition, appears severely malnourished, no current family support available  For questions or updates, please contact [facility] Please consult [email or web address] for contact info under Cardiology/STEMI.  Signed, Laurann Montana, PA-C 06/13/2022, 10:02 AM

## 2022-06-13 NOTE — Progress Notes (Signed)
Daily Progress Note   Patient Name: Alexander Greene       Date: 06/13/2022 DOB: Dec 24, 1955  Age: 66 y.o. MRN#: [id number] Attending Physician: Lorin Glass, MD Primary Care Physician: Patient, No Pcp Per Admit Date: 07-06-22  Reason for Consultation/Follow-up: Establishing goals of care  Subjective: Discussed case with Dr. Katrinka Blazing - he was able to speak with niece/Alexander today; however, she was not willing to engage for medical decision making on patient's behalf at that time.  Chart review performed. Received report from primary RN - no acute concerns. RN reports patient is more alert today. She tells me patient's roommate came to see patient today and is attempting to assist in locating his other family members. Per his report to RN, patient was independent prior to hospitalization; however, he has noted a weight loss over the last several years.  Went to visit patient at bedside - no family/visitors present. Patient was awake and alert, oriented to self, year, knew he was in [place] but unaware he was in the hospital or his situation. He is not able to make complex medical decisions. His speech is soft and mumbled. He smiles during interaction. He is restless in bed with mitts in place. Coretrak in use. I asked patient if he had family we could call - he stated "[name]" and was not able to name anyone else.  5:12 PM Attempted to call niece/Alexander Greene [phone] to discuss diagnosis, prognosis, GOC, EOL wishes, disposition, and options - no answer - confidential voicemail left and PMT phone number provided with request to return call.   Length of Stay: 2  Current Medications: Scheduled Meds:   Chlorhexidine Gluconate Cloth  6 each Topical q morning   free water  200 mL Per Tube Q6H    insulin aspart  2-6 Units Subcutaneous Q4H   insulin glargine-yfgn  10 Units Subcutaneous Daily   multivitamin with minerals  1 tablet Per Tube Daily   mouth rinse  15 mL Mouth Rinse 4 times per day   pantoprazole  40 mg Per Tube Daily   thiamine  100 mg Per Tube Daily    Continuous Infusions:   ceFAZolin (ANCEF) IV Stopped (06/13/22 1415)   feeding supplement (VITAL AF 1.2 CAL) 40 mL/hr at 06/13/22 1600   heparin 1,050 Units/hr (06/13/22 1600)  PRN Meds: acetaminophen (TYLENOL) oral liquid 160 mg/5 mL, docusate sodium, HYDROmorphone (DILAUDID) injection, mouth rinse, polyethylene glycol  Physical Exam Vitals and nursing note reviewed.  Constitutional:      General: He is not in acute distress.    Appearance: He is cachectic. He is ill-appearing.  Pulmonary:     Effort: No respiratory distress.  Skin:    General: Skin is warm and dry.  Neurological:     Mental Status: He is alert. He is confused.     Motor: Weakness present.  Psychiatric:        Attention and Perception: He is inattentive.        Speech: Speech is delayed.        Behavior: Behavior is cooperative.        Cognition and Memory: Cognition is impaired. Memory is impaired.        Judgment: Judgment is impulsive.             Vital Signs: BP 97/72   Pulse (!) 105   Temp 99.2 F (37.3 C) (Axillary)   Resp (!) 24   Ht 5\' 11"  (1.803 m)   Wt 54.8 kg   SpO2 100%   BMI 16.85 kg/m  SpO2: SpO2: 100 % O2 Device: O2 Device: Room Air O2 Flow Rate:    Intake/output summary:  Intake/Output Summary (Last 24 hours) at 06/13/2022 1649 Last data filed at 06/13/2022 1600 Gross per 24 hour  Intake 2248.52 ml  Output 1000 ml  Net 1248.52 ml   LBM: Last BM Date :  (PTA) Baseline Weight: Weight: 68 kg Most recent weight: Weight: 54.8 kg       Palliative Assessment/Data: PPS 30% with tube feeds.      Patient Active Problem List   Diagnosis Date Noted   Protein-calorie malnutrition, severe 06/12/2022    Hypernatremia    MSSA bacteremia    Acute pulmonary embolism (HCC)    LV (left ventricular) mural thrombus    Splenic infarct    Altered mental status    Femoral artery occlusion (HCC)    Acute combined systolic and diastolic heart failure [facility])    Metabolic encephalopathy 05/30/2022    Palliative Care Assessment & Plan   Patient Profile: 66 y.o. male  with unknown past medical history presented to ED on 05/31/2022 after being found down on ground for unknown period of time by roommate. Last was seen two days prior. Patient was admitted on 06/22/2022 with acute metabolic encephalopathy in setting of hypernatremia and severe dehydration, acute rhabdomyolysis, hyperkalemia, demand cardiac ischemia, RLL PE and possible LV thrombus, left common femoral artery occlusion, right common iliac artery occlusion, malnutrition, MSSA bacteremia. Vascular surgery was consulted for evaluation of ischemic lower extremities - patient will require iliofemoral embolectomy to the right lower extremity as well as left AKA, but is at high risk for bilateral lower extremity limb loss; needs medical stabilization prior to operative interventions. Cardiology was consulted for the evaluation of decreased LVEF of 20-25% in setting of elevated troponin - given severely decreased LV function, elevated troponin, and ECG abnormalities, patient could be considered for LHC with LVEDP evaluation once medically stable. However, in the setting of multiple acute medical issues of significant severity, urgent catheterization is not indicated and unlikely to modify patient's long-term prognosis.  Assessment: Principal Problem:   Metabolic encephalopathy Active Problems:   Hypernatremia   MSSA bacteremia   Acute pulmonary embolism (HCC)   LV (left ventricular) mural thrombus   Splenic infarct  Protein-calorie malnutrition, severe   Altered mental status   Femoral artery occlusion (HCC)   Acute combined systolic and diastolic heart  failure (HCC)   Concern about end of life  Recommendations/Plan: Unfortunately, still unable to reach niece/Alexander today; however, was able to leave VM with PMT number and request to return call. Will continue to attempt contact. If patient's encephalopathy improves where he is able to make complex medical decisions, will attempt GOC with patient PMT will continue to follow and support holistically   Goals of Care and Additional Recommendations: Limitations on Scope of Treatment: Full Scope Treatment  Code Status:    Code Status Orders  (From admission, onward)           Start     Ordered   06/13/2022 1620  Full code  Continuous        06/06/2022 1624           Code Status History     This patient has a current code status but no historical code status.       Prognosis:  Poor  Discharge Planning: To Be Determined  Care plan was discussed with primary RN, Dr. Katrinka Blazing, Dr. Karin Lieu  Thank you for allowing the Palliative Medicine Team to assist in the care of this patient.   Total Time 35 minutes Prolonged Time Billed  no       Greater than 50%  of this time was spent counseling and coordinating care related to the above assessment and plan.  Haskel Khan, NP  Please contact Palliative Medicine Team phone at [phone] for questions and concerns.   *Portions of this note are a verbal dictation therefore any spelling and/or grammatical errors are due to the "[employer]" system interpretation.

## 2022-06-13 NOTE — Progress Notes (Signed)
ANTICOAGULATION CONSULT NOTE  Pharmacy Consult for heparin Indication: pulmonary embolus  No Known Allergies  Patient Measurements: Height: 5\' 11"  (180.3 cm) Weight: 54.8 kg (120 lb 13 oz) IBW/kg (Calculated) : 75.3 Heparin Dosing Weight: 54.4kg  Vital Signs: Temp: 99.2 F (37.3 C) (09/15 1200) Temp Source: Axillary (09/15 1200) BP: 100/88 (09/15 1300) Pulse Rate: 111 (09/15 1300)  Labs: Recent Labs    06/04/2022 1108 06/02/2022 1139 06/03/2022 1517 06/09/2022 1548 06/23/2022 1715 06/12/22 0013 06/12/22 0412 06/12/22 0628 06/13/22 0719 06/13/22 1217  HGB 15.8   < > 14.3  --   --   --  14.0  --  12.5*  --   HCT 52.8*   < > 42.0  --   --   --  45.6  --  40.5  --   PLT 195  --   --  186  --   --  176  --  169  --   APTT 24  --   --  25  --   --   --   --   --   --   LABPROT 15.1  --   --  16.0*  --   --   --   --   --   --   INR 1.2  --   --  1.3*  --   --   --   --   --   --   HEPARINUNFRC  --   --   --   --    < >  --   --  0.39 0.22* 0.34  CREATININE 0.80  --   --   --    < > 0.86 0.74  --  0.73  --   CKTOTAL 2,897*  --   --   --   --   --  2,480*  --   --   --   TROPONINIHS  --    < > 690*  --   --   --   --  1,345* 640*  --    < > = values in this interval not displayed.     Estimated Creatinine Clearance: 71.4 mL/min (by C-G formula based on SCr of 0.73 mg/dL).   Medical History: No past medical history on file.  Medications:  Medications Prior to Admission  Medication Sig Dispense Refill Last Dose   methocarbamol (ROBAXIN) 500 MG tablet Take 1 tablet (500 mg total) by mouth 2 (two) times daily. 20 tablet 0    Scheduled:   Chlorhexidine Gluconate Cloth  6 each Topical q morning   free water  200 mL Per Tube Q6H   insulin aspart  2-6 Units Subcutaneous Q4H   insulin glargine-yfgn  10 Units Subcutaneous Daily   multivitamin with minerals  1 tablet Per Tube Daily   mouth rinse  15 mL Mouth Rinse 4 times per day   pantoprazole  40 mg Per Tube Daily   thiamine   100 mg Per Tube Daily    Assessment: 72 yoM who was brought in by EMS, found down at his home and had altered mental status. PMH is unknown at this time. CT showed multiple clots. Pharmacy is being consulted to dose heparin for pulmonary embolism. No PTA anticoagulants.  Morning heparin level resulted as 0.22 while on 1000 units/hr. Had previously been therapeutic with heparin level of 0.39. RN reports to issues with line overnight. Ordered stat repeat level which came back as 0.34 (therapeutic)   Goal  of Therapy:  Heparin level 0.3-0.7 units/ml Monitor platelets by anticoagulation protocol: Yes   Plan:  Increase heparin to 1050 units/hr to ensure remains in therapeutic range - next heparin level tomorrow AM Daily heparin level and CBC  Avyukt Cimo Clinical Pharmacist 06/13/2022 1:23 PM

## 2022-06-13 NOTE — Progress Notes (Signed)
Evening rounds  No events.  MRI confirming embolic strokes.  PMT unfortunately not able to reach family.  Continue supportive care with hopes that patient will be able to make his own decisions RE: GOC.  If this is not successful we may have to rely on his friend/neighbor vs. His niece.  Will have neuro come on case for GDMT should he stay full scope of care.  Myrla Halsted MD PCCM

## 2022-06-14 ENCOUNTER — Encounter (HOSPITAL_COMMUNITY): Payer: Self-pay | Admitting: Certified Registered"

## 2022-06-14 DIAGNOSIS — A419 Sepsis, unspecified organism: Secondary | ICD-10-CM

## 2022-06-14 DIAGNOSIS — R6521 Severe sepsis with septic shock: Secondary | ICD-10-CM

## 2022-06-14 DIAGNOSIS — I7025 Atherosclerosis of native arteries of other extremities with ulceration: Secondary | ICD-10-CM

## 2022-06-14 DIAGNOSIS — D735 Infarction of spleen: Secondary | ICD-10-CM

## 2022-06-14 DIAGNOSIS — Z7189 Other specified counseling: Secondary | ICD-10-CM

## 2022-06-14 DIAGNOSIS — L97509 Non-pressure chronic ulcer of other part of unspecified foot with unspecified severity: Secondary | ICD-10-CM

## 2022-06-14 LAB — BASIC METABOLIC PANEL
Anion gap: 11 (ref 5–15)
BUN: 15 mg/dL (ref 8–23)
CO2: 27 mmol/L (ref 22–32)
Calcium: 7.3 mg/dL — ABNORMAL LOW (ref 8.9–10.3)
Chloride: 107 mmol/L (ref 98–111)
Creatinine, Ser: 0.71 mg/dL (ref 0.61–1.24)
GFR, Estimated: >60 mL/min (ref >60)
Glucose, Bld: 193 mg/dL — ABNORMAL HIGH (ref 70–99)
Potassium: 3.6 mmol/L (ref 3.5–5.1)
Sodium: 145 mmol/L (ref 135–145)

## 2022-06-14 LAB — SODIUM: Sodium: 143 mmol/L (ref 135–145)

## 2022-06-14 LAB — LIPID PANEL
Cholesterol: 110 mg/dL (ref 0–200)
HDL: 18 mg/dL — ABNORMAL LOW (ref >40)
LDL Cholesterol: 74 mg/dL (ref 0–99)
Total CHOL/HDL Ratio: 6.1 RATIO
Triglycerides: 88 mg/dL (ref <150)
VLDL: 18 mg/dL (ref 0–40)

## 2022-06-14 LAB — GLUCOSE, CAPILLARY
Glucose-Capillary: 134 mg/dL — ABNORMAL HIGH (ref 70–99)
Glucose-Capillary: 195 mg/dL — ABNORMAL HIGH (ref 70–99)
Glucose-Capillary: 182 mg/dL — ABNORMAL HIGH (ref 70–99)
Glucose-Capillary: 186 mg/dL — ABNORMAL HIGH (ref 70–99)
Glucose-Capillary: 177 mg/dL — ABNORMAL HIGH (ref 70–99)
Glucose-Capillary: 156 mg/dL — ABNORMAL HIGH (ref 70–99)

## 2022-06-14 LAB — CBC
HCT: 37.2 % — ABNORMAL LOW (ref 39.0–52.0)
Hemoglobin: 12.3 g/dL — ABNORMAL LOW (ref 13.0–17.0)
MCH: 30.5 pg (ref 26.0–34.0)
MCHC: 33.1 g/dL (ref 30.0–36.0)
MCV: 92.3 fL (ref 80.0–100.0)
Platelets: 180 10*3/uL (ref 150–400)
RBC: 4.03 MIL/uL — ABNORMAL LOW (ref 4.22–5.81)
RDW: 13.3 % (ref 11.5–15.5)
WBC: 7.8 10*3/uL (ref 4.0–10.5)
nRBC: 0 % (ref 0.0–0.2)

## 2022-06-14 LAB — PHOSPHORUS
Phosphorus: 1.9 mg/dL — ABNORMAL LOW (ref 2.5–4.6)
Phosphorus: 2.9 mg/dL (ref 2.5–4.6)

## 2022-06-14 LAB — HEPARIN LEVEL (UNFRACTIONATED)
Heparin Unfractionated: 0.38 IU/mL (ref 0.30–0.70)
Heparin Unfractionated: 0.54 IU/mL (ref 0.30–0.70)

## 2022-06-14 LAB — MAGNESIUM: Magnesium: 1.8 mg/dL (ref 1.7–2.4)

## 2022-06-14 MED ORDER — POTASSIUM PHOSPHATES 15 MMOLE/5ML IV SOLN
30.0000 mmol | Freq: Once | INTRAVENOUS | Status: AC
  Administered 2022-06-14: 30 mmol via INTRAVENOUS
  Filled 2022-06-14 (×2): qty 10

## 2022-06-14 MED ORDER — MAGNESIUM SULFATE 2 GM/50ML IV SOLN
2.0000 g | Freq: Once | INTRAVENOUS | Status: AC
  Administered 2022-06-14: 2 g via INTRAVENOUS
  Filled 2022-06-14: qty 50

## 2022-06-14 MED ORDER — ACETAMINOPHEN 160 MG/5ML PO SOLN
ORAL | Status: AC
  Filled 2022-06-14: qty 20.3

## 2022-06-14 MED ORDER — ACETAMINOPHEN 160 MG/5ML PO SOLN
650.0000 mg | Freq: Four times a day (QID) | ORAL | Status: DC | PRN
  Administered 2022-06-14 – 2022-06-17 (×5): 650 mg
  Filled 2022-06-14 (×4): qty 20.3

## 2022-06-14 NOTE — Progress Notes (Signed)
STROKE TEAM PROGRESS NOTE   SUBJECTIVE (INTERVAL HISTORY) His RN is at the bedside.  Patient eyes open, however not orientated.  Bilateral lower extremity ischemia, vascular surgery on board, plan for amputation tomorrow.  Palliative care on board, trying to find next of kin.   OBJECTIVE Temp:  [99.1 F (37.3 C)-100.2 F (37.9 C)] 100.2 F (37.9 C) (09/16 0800) Pulse Rate:  [97-111] 106 (09/16 1100) Resp:  [13-25] 21 (09/16 1100) BP: (84-119)/(58-88) 96/69 (09/16 1100) SpO2:  [69 %-100 %] 99 % (09/16 1100) Weight:  [55.1 kg] 55.1 kg (09/16 0500)  Recent Labs  Lab 06/13/22 1941 06/13/22 2339 06/14/22 0358 06/14/22 0739 06/14/22 1130  GLUCAP 134* 109* 134* 195* 182*   Recent Labs  Lab 06/13/2022 1715 06/28/2022 2013 06/12/22 0013 06/12/22 0412 06/12/22 0628 06/12/22 1616 06/12/22 1815 06/12/22 2223 06/13/22 0158 06/13/22 0719 06/13/22 1819 06/14/22 0559  NA 163*   < > 162* 163*   < > 157*   < > 155* 153* 152* 149* 145  K 2.9*  --  2.6* 3.2*  --   --   --   --   --  2.8*  --  3.6  CL 123*  --  126* 121*  --   --   --   --   --  114*  --  107  CO2 27  --  30 32  --   --   --   --   --  29  --  27  GLUCOSE 346*  --  195* 87  --   --   --   --   --  137*  --  193*  BUN 13  --  7* 8  --   --   --   --   --  10  --  15  CREATININE 0.82  --  0.86 0.74  --   --   --   --   --  0.73  --  0.71  CALCIUM 8.2*  --  8.0* 8.4*  --   --   --   --   --  7.8*  --  7.3*  MG  --   --  1.7 2.4  --  1.9  --   --   --  1.9 2.0 1.8  PHOS  --   --   --  1.0*  --   --   --   --   --  3.1 2.1* 1.9*   < > = values in this interval not displayed.   Recent Labs  Lab 06/12/2022 1108  AST 77*  ALT 87*  ALKPHOS 124  BILITOT 1.9*  PROT 6.9  ALBUMIN 2.4*   Recent Labs  Lab 06/07/2022 1108 06/18/2022 1139 06/22/2022 1517 06/09/2022 1548 06/12/22 0412 06/13/22 0719 06/14/22 0559  WBC 10.3  --   --   --  11.7* 9.0 7.8  NEUTROABS 9.0*  --   --   --   --   --   --   HGB 15.8 16.3 14.3  --  14.0  12.5* 12.3*  HCT 52.8* 48.0 42.0  --  45.6 40.5 37.2*  MCV 101.1*  --   --   --  97.2 95.5 92.3  PLT 195  --   --  186 176 169 180   Recent Labs  Lab 05/31/2022 1108 06/12/22 0412  CKTOTAL 2,897* 2,480*   Recent Labs    06/14/2022 1548  LABPROT 16.0*  INR 1.3*  Recent Labs    06/06/2022 1450  COLORURINE AMBER*  LABSPEC 1.027  PHURINE 5.0  GLUCOSEU >=500*  HGBUR MODERATE*  BILIRUBINUR NEGATIVE  KETONESUR 20*  PROTEINUR 30*  NITRITE NEGATIVE  LEUKOCYTESUR NEGATIVE       Component Value Date/Time   CHOL 110 06/14/2022 0559   TRIG 88 06/14/2022 0559   HDL 18 (L) 06/14/2022 0559   CHOLHDL 6.1 06/14/2022 0559   VLDL 18 06/14/2022 0559   LDLCALC 74 06/14/2022 0559   Lab Results  Component Value Date   HGBA1C 12.6 (H) 06/12/2022      Component Value Date/Time   LABOPIA NONE DETECTED 06/10/2022 1450   COCAINSCRNUR NONE DETECTED 06/08/2022 1450   LABBENZ NONE DETECTED 06/04/2022 1450   AMPHETMU NONE DETECTED 06/06/2022 1450   THCU NONE DETECTED 06/18/2022 1450   LABBARB NONE DETECTED 06/06/2022 1450    Recent Labs  Lab 06/24/2022 1105  ETH <10    I have personally reviewed the radiological images below and agree with the radiology interpretations.  MR BRAIN WO CONTRAST  Result Date: 06/13/2022 CLINICAL DATA:  Stroke follow-up EXAM: MRI HEAD WITHOUT CONTRAST TECHNIQUE: Multiplanar, multiecho pulse sequences of the brain and surrounding structures were obtained without intravenous contrast. COMPARISON:  CT head 06/03/2022 FINDINGS: Brain: Limited exam. Patient not able to complete all sequences. Images available are degraded by motion. Restricted diffusion and acute infarct in the occipital lobes bilaterally. Small areas of acute infarct extend into the left parietal lobe and splenium of the corpus callosum. Small acute infarct right posterior frontal white matter. Gradient echo imaging not performed to evaluate for hemorrhage. Ventricle size normal. No midline shift. No  significant chronic ischemic changes in the white matter. No mass lesion Vascular: Normal arterial flow voids Skull and upper cervical spine: No focal skeletal abnormality detected Sinuses/Orbits: Negative Other: None IMPRESSION: Limited study. Patient not able to complete the study and not able to hold still. Acute infarct in the occipital lobes bilaterally. This extends into the left parietal lobe and splenium. Small acute infarct right posterior frontal white matter. Electronically Signed   By: Franchot Gallo M.D.   On: 06/13/2022 11:20   ECHOCARDIOGRAM COMPLETE  Result Date: 06/12/2022    ECHOCARDIOGRAM REPORT   Patient Name:   Alexander Greene Date of Exam: 06/12/2022 Medical Rec #:  [id number]          Height:       71.0 in Accession #:    [id number]         Weight:       113.1 lb Date of Birth:  1956/02/05          BSA:          1.655 m Patient Age:    66 years           BP:           105/76 mmHg Patient Gender: M                  HR:           96 bpm. Exam Location:  Inpatient Procedure: 2D Echo, Cardiac Doppler, Color Doppler, Intracardiac Opacification            Agent and Saline Contrast Bubble Study Indications:    Pulmonary embolus  History:        Patient has no prior history of Echocardiogram examinations.  Sonographer:    Memory Argue Referring Phys: Emerson  1. No  mural thrombus with Definity contrast. Left ventricular ejection fraction, by estimation, is 20 to 25%. Left ventricular ejection fraction by 2D MOD biplane is 20.7 %. The left ventricle has severely decreased function. The left ventricle demonstrates global hypokinesis. There is moderate left ventricular hypertrophy. Left ventricular diastolic parameters are indeterminate.  2. Right ventricular systolic function is mildly reduced. The right ventricular size is normal. Tricuspid regurgitation signal is inadequate for assessing PA pressure.  3. Left atrial size was severely dilated.  4. The mitral valve is  abnormal. Trivial mitral valve regurgitation.  5. The aortic valve is tricuspid. Aortic valve regurgitation is not visualized.  6. Aortic dilatation noted. There is borderline dilatation of the aortic root, measuring 38 mm.  7. The inferior vena cava is normal in size with <50% respiratory variability, suggesting right atrial pressure of 8 mmHg.  8. Agitated saline contrast bubble study was negative, with no evidence of any interatrial shunt. Comparison(s): No prior Echocardiogram. FINDINGS  Left Ventricle: No mural thrombus with Definity contrast. Left ventricular ejection fraction, by estimation, is 20 to 25%. Left ventricular ejection fraction by 2D MOD biplane is 20.7 %. The left ventricle has severely decreased function. The left ventricle demonstrates global hypokinesis. The left ventricular internal cavity size was normal in size. There is moderate left ventricular hypertrophy. Left ventricular diastolic parameters are indeterminate. Right Ventricle: The right ventricular size is normal. No increase in right ventricular wall thickness. Right ventricular systolic function is mildly reduced. Tricuspid regurgitation signal is inadequate for assessing PA pressure. Left Atrium: Left atrial size was severely dilated. Right Atrium: Right atrial size was normal in size. Pericardium: There is no evidence of pericardial effusion. Mitral Valve: The mitral valve is abnormal. There is moderate thickening of the anterior and posterior mitral valve leaflet(s). Trivial mitral valve regurgitation. Tricuspid Valve: The tricuspid valve is grossly normal. Tricuspid valve regurgitation is trivial. Aortic Valve: The aortic valve is tricuspid. Aortic valve regurgitation is not visualized. Aortic valve mean gradient measures 4.0 mmHg. Aortic valve peak gradient measures 7.3 mmHg. Aortic valve area, by VTI measures 1.84 cm. Pulmonic Valve: The pulmonic valve was normal in structure. Pulmonic valve regurgitation is not visualized.  Aorta: Aortic dilatation noted. There is borderline dilatation of the aortic root, measuring 38 mm. Venous: The inferior vena cava is normal in size with less than 50% respiratory variability, suggesting right atrial pressure of 8 mmHg. IAS/Shunts: There is right bowing of the interatrial septum, suggestive of elevated left atrial pressure. No atrial level shunt detected by color flow Doppler. Agitated saline contrast was given intravenously to evaluate for intracardiac shunting. Agitated saline contrast bubble study was negative, with no evidence of any interatrial shunt.  LEFT VENTRICLE PLAX 2D                        Biplane EF (MOD) LVIDd:         5.50 cm         LV Biplane EF:   Left LVIDs:         5.00 cm                          ventricular LV PW:         1.30 cm                          ejection LV IVS:  1.30 cm                          fraction by LVOT diam:     2.20 cm                          2D MOD LV SV:         40                               biplane is LV SV Index:   24                               20.7 %. LVOT Area:     3.80 cm                                Diastology                                LV e' medial:    7.18 cm/s LV Volumes (MOD)               LV E/e' medial:  12.6 LV vol d, MOD    157.0 ml      LV e' lateral:   5.44 cm/s A2C:                           LV E/e' lateral: 16.6 LV vol d, MOD    182.0 ml A4C: LV vol s, MOD    130.0 ml A2C: LV vol s, MOD    131.0 ml A4C: LV SV MOD A2C:   27.0 ml LV SV MOD A4C:   182.0 ml LV SV MOD BP:    35.3 ml RIGHT VENTRICLE TAPSE (M-mode): 1.8 cm LEFT ATRIUM              Index        RIGHT ATRIUM           Index LA Vol (A2C):   88.5 ml  53.48 ml/m  RA Area:     13.40 cm LA Vol (A4C):   111.0 ml 67.07 ml/m  RA Volume:   33.00 ml  19.94 ml/m LA Biplane Vol: 100.0 ml 60.43 ml/m  AORTIC VALVE AV Area (Vmax):    2.06 cm AV Area (Vmean):   2.02 cm AV Area (VTI):     1.84 cm AV Vmax:           135.00 cm/s AV Vmean:          97.900 cm/s AV VTI:             0.219 m AV Peak Grad:      7.3 mmHg AV Mean Grad:      4.0 mmHg LVOT Vmax:         73.10 cm/s LVOT Vmean:        52.000 cm/s LVOT VTI:          0.106 m LVOT/AV VTI ratio: 0.48  AORTA Ao Root diam: 3.65 cm Ao Asc diam:  3.20 cm MITRAL VALVE MV Area (PHT): 5.34 cm    SHUNTS MV Decel Time: 142 msec  Systemic VTI:  0.11 m MV E velocity: 90.40 cm/s  Systemic Diam: 2.20 cm MV A velocity: 80.10 cm/s MV E/A ratio:  1.13 Lyman Bishop MD Electronically signed by Lyman Bishop MD Signature Date/Time: 06/12/2022/1:14:27 PM    Final    DG Abd Portable 1V  Result Date: 06/12/2022 CLINICAL DATA:  Altered mental status EXAM: PORTABLE ABDOMEN - 1 VIEW COMPARISON:  None Available. FINDINGS: Enteric tube passes into the stomach with tip overlying the pyloric region. Bowel gas pattern is unremarkable. IMPRESSION: Enteric tube within the distal stomach. Electronically Signed   By: Macy Mis M.D.   On: 06/12/2022 10:36   DG Chest Port 1 View  Result Date: 06/12/2022 CLINICAL DATA:  Shortness of breath.  Metabolic encephalopathy. EXAM: PORTABLE CHEST 1 VIEW COMPARISON:  Yesterday FINDINGS: Numerous leads and wires project over the chest. Midline trachea. Borderline cardiomegaly. No pleural effusion or pneumothorax. Clear lungs. IMPRESSION: Borderline cardiomegaly, without acute disease. Electronically Signed   By: Abigail Miyamoto M.D.   On: 06/12/2022 08:22   CT CHEST ABDOMEN PELVIS W CONTRAST  Result Date: 06/25/2022 CLINICAL DATA:  A 66 year old male presents with history of sepsis. EXAM: CT CHEST, ABDOMEN, AND PELVIS WITH CONTRAST TECHNIQUE: Multidetector CT imaging of the chest, abdomen and pelvis was performed following the standard protocol during bolus administration of intravenous contrast. RADIATION DOSE REDUCTION: This exam was performed according to the departmental dose-optimization program which includes automated exposure control, adjustment of the mA and/or kV according to patient size and/or use of  iterative reconstruction technique. CONTRAST:  55mL OMNIPAQUE IOHEXOL 350 MG/ML SOLN COMPARISON:  Cervical spine evaluation performed the same date. FINDINGS: CT CHEST FINDINGS Cardiovascular: Low-attenuation about papillary muscles near the LEFT ventricular apex raising the question of small amounts of thrombus adherent to papillary musculature in the LEFT ventricle. LEFT ventricular dilation. No LEFT atrial thrombus. Signs of RIGHT lower lobe pulmonary artery emboli in basilar segmental level branches (image 37/3) Central pulmonary vasculature is normal caliber. The aorta is normal caliber with smooth contour. Mediastinum/Nodes: No signs of adenopathy in the chest. Esophagus is grossly normal. Lungs/Pleura: No lobar consolidation. RIGHT upper lobe pulmonary nodule (image 35/5) 8 x 6 mm. Airways are patent. Small bilateral pleural effusions are present layering dependently in the chest. No pneumothorax. Musculoskeletal: See below for full musculoskeletal details. CT ABDOMEN PELVIS FINDINGS Hepatobiliary: Hepatic steatosis. Smooth hepatic contours. Patent portal vein. No suspicious hepatic lesion. No acute biliary process. Pancreas: Mild atrophy of the pancreas without ductal dilation, inflammation or visible lesion. 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 Adrenals/Urinary Tract: Adrenal glands are normal. 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 Heterogeneity in general of the nephrogram a stray shin seen bilaterally. Striated nephrogram can also be seen in the setting of pyelonephritis but there is at least 1 area that is strongly suggestive of if not diagnostic for renal infarct. Other areas also show focal characteristics either infarct or focal nephritis. Renal veins are patent. Stomach/Bowel: No stranding adjacent to the stomach.  No small bowel obstruction. Mild generalize stranding in the mesentery of the small bowel, of uncertain significance in the setting of small bilateral effusions and body wall edema. This may be developing generalized anasarca. 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 Chronicity uncertain, potentially acute/recent  though with reconstitution of flow seen in superficial and deep femoral arteries distal to the site of occlusion in the upper thigh. Also however with occluded superficial femoral artery on the LEFT (image 117/3) No signs of adenopathy in the abdomen or the pelvis. Remaining branches of the abdominal aorta grossly patent this venous phase assessment. Reproductive: Prostate mild to moderately enlarged. Urinary bladder without focal thickening or gross perivesical stranding. Other: Body wall edema.  No ascites.  No pneumoperitoneum. Musculoskeletal: No acute bone finding. No destructive bone process. Spinal degenerative changes. IMPRESSION: 1. RIGHT lower lobe pulmonary artery emboli in basilar segmental level branches. 2. 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 Given focal appearance of some of these areas would suggest 3 to six-month follow-up to ensure no underlying lesion is present. 3. 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 Findings are suspicious for recent and or acute occlusion superimposed on chronic vascular disease. 4. 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 Vascular surgery  consultation is suggested. 5. 7 mm right solid pulmonary nodule within the upper lobe. Per Fleischner Society Guidelines, recommend a non-contrast Chest CT at 6-12 months. If patient is high risk for malignancy, recommend an additional non-contrast Chest CT at 18-24 months; if patient is low risk for malignancy a non-contrast Chest CT at 18-24 months is optional. These guidelines do not apply to immunocompromised patients and patients with cancer. Follow up in patients with significant comorbidities as clinically warranted. For lung cancer screening, adhere to Lung-RADS guidelines. Reference: Radiology. 2017; 284(1):228-43. Critical Value/emergent results were called by telephone at the time of interpretation on 06/06/2022 at 3:01 pm to provider [facility] , who verbally acknowledged these results. Electronically Signed   By: Zetta Bills M.D.   On: 06/21/2022 15:01   CT Head Wo Contrast  Result Date: 06/20/2022 CLINICAL DATA:  Altered mental status, neck injury. EXAM: CT HEAD WITHOUT CONTRAST CT CERVICAL SPINE WITHOUT CONTRAST TECHNIQUE: Multidetector CT imaging of the head and cervical spine was performed following the standard protocol without intravenous contrast. Multiplanar CT image reconstructions of the cervical spine were also generated. Unfortunately, exam is limited due to patient motion artifact. RADIATION DOSE REDUCTION: This exam was performed according to the departmental dose-optimization program which includes automated exposure control, adjustment of the mA and/or kV according to patient size and/or use of iterative reconstruction technique. COMPARISON:  April 05, 2021. FINDINGS: CT HEAD FINDINGS Brain: No evidence of acute infarction, hemorrhage, hydrocephalus, extra-axial collection or mass lesion/mass effect. Vascular: No hyperdense vessel or unexpected calcification. Skull: Normal. Negative for fracture or focal lesion. Sinuses/Orbits: No acute finding. Other: None. CT CERVICAL SPINE  FINDINGS Alignment: Normal. Skull base and vertebrae: Only the first 6 cervical vertebra are well visualized due to patient motion artifact. No fracture or bony abnormality is involving these. Pathology involving C7 cannot be excluded on the basis of this exam. Soft tissues and spinal canal: No prevertebral fluid or swelling seen in visualized areas. No visible canal hematoma. Disc levels:  Moderate degenerative disc disease is noted at C3-4. Upper chest: Not visualized due to patient motion artifact. Other: None. IMPRESSION: No definite acute intracranial abnormality  seen. Due to patient motion artifact, only the first 6 of cervical vertebra are well visualized. No fracture or spondylolisthesis is seen involving these visualized vertebra. Fracture or other pathology involving C7 vertebra cannot be excluded on the basis of this exam. Electronically Signed   By: Marijo Conception M.D.   On: 06/06/2022 13:21   CT Cervical Spine Wo Contrast  Result Date: 06/28/2022 CLINICAL DATA:  Altered mental status, neck injury. EXAM: CT HEAD WITHOUT CONTRAST CT CERVICAL SPINE WITHOUT CONTRAST TECHNIQUE: Multidetector CT imaging of the head and cervical spine was performed following the standard protocol without intravenous contrast. Multiplanar CT image reconstructions of the cervical spine were also generated. Unfortunately, exam is limited due to patient motion artifact. RADIATION DOSE REDUCTION: This exam was performed according to the departmental dose-optimization program which includes automated exposure control, adjustment of the mA and/or kV according to patient size and/or use of iterative reconstruction technique. COMPARISON:  April 05, 2021. FINDINGS: CT HEAD FINDINGS Brain: No evidence of acute infarction, hemorrhage, hydrocephalus, extra-axial collection or mass lesion/mass effect. Vascular: No hyperdense vessel or unexpected calcification. Skull: Normal. Negative for fracture or focal lesion. Sinuses/Orbits: No acute  finding. Other: None. CT CERVICAL SPINE FINDINGS Alignment: Normal. Skull base and vertebrae: Only the first 6 cervical vertebra are well visualized due to patient motion artifact. No fracture or bony abnormality is involving these. Pathology involving C7 cannot be excluded on the basis of this exam. Soft tissues and spinal canal: No prevertebral fluid or swelling seen in visualized areas. No visible canal hematoma. Disc levels:  Moderate degenerative disc disease is noted at C3-4. Upper chest: Not visualized due to patient motion artifact. Other: None. IMPRESSION: No definite acute intracranial abnormality seen. Due to patient motion artifact, only the first 6 of cervical vertebra are well visualized. No fracture or spondylolisthesis is seen involving these visualized vertebra. Fracture or other pathology involving C7 vertebra cannot be excluded on the basis of this exam. Electronically Signed   By: Marijo Conception M.D.   On: 06/02/2022 13:21   DG Chest Port 1 View  Result Date: 06/20/2022 : Questionable sepsis - evaluate for abnormality EXAM: PORTABLE CHEST 1 VIEW COMPARISON:  Chest x-ray 04/05/2021. FINDINGS: Mild linear right basilar opacity. No confluent consolidation. No visible pleural effusions pneumothorax pre cardiomediastinal silhouette is within normal limits when accounting for technique soft were taken. IMPRESSION: Mild linear right basilar opacity, favor subsegmental atelectasis. No confluent consolidation. Electronically Signed   By: Margaretha Sheffield M.D.   On: 06/16/2022 11:41     PHYSICAL EXAM  Temp:  [99.1 F (37.3 C)-100.2 F (37.9 C)] 100.2 F (37.9 C) (09/16 0800) Pulse Rate:  [97-111] 106 (09/16 1100) Resp:  [13-25] 21 (09/16 1100) BP: (84-119)/(58-88) 96/69 (09/16 1100) SpO2:  [69 %-100 %] 99 % (09/16 1100) Weight:  [55.1 kg] 55.1 kg (09/16 0500)  General - Well nourished, well developed, in no apparent distress.  Ophthalmologic - fundi not visualized due to  noncooperation.  Cardiovascular - Regular rhythm and mild tachycardia.  Neuro - awake, alert, eyes open, not orientated to age, place, or time. No aphasia but severe dysarthia and paucity of speech, following most simple commands. Able to name 2/2 and repeat simple sentence in dysarthric voice. No gaze palsy but bilateral gaze incomplete, not tracking, not blinking to visual threat bilaterally. No significant facial droop. Tongue midline. Bilateral UEs 3+/5, no drift. LLE gangrene below the knee, cold, slight muscle contraction on request not against gravity. RLE cold below  the knee, about 3-/5 slightly against gravity. Sensation not cooperative, b/l FTN b/l dysmetria left more than right, gait not tested.     ASSESSMENT/PLAN Alexander Greene is a 66 y.o. male with unclear medical history admitted for unresponsiveness, found to have hyponatremia, hypothermia, rhabdomyolysis, AKI.   Stroke:  bilateral PCA infarcts, small left MCA and right MCA/ACA infarcts, embolic pattern likely due to LV thrombus, cardiomyopathy with low EF, PE if PFO present, or endocarditis. CT head no acute abnormality MRI brain acute infarct in the occipital lobes bilaterally, this extends into the left parietal lobe and splenium, small acute infarct right posterior frontal white matter. MRA pending Carotid Doppler pending 2D Echo EF 20 to 25%, no LV thrombus LE venous Doppler pending LDL 74 HgbA1c 12.6 UDS negative Heparin IV for VTE prophylaxis No antithrombotic prior to admission, now on heparin IV.  Ongoing aggressive stroke risk factor management Therapy recommendations: Pending Disposition: Pending, palliative care on board trying to find a family  PE CT chest showed right lower lobe PE On heparin IV TCD bubble study pending LE venous Doppler pending  LV thrombus Cardiomyopathy CT chest showed LV thrombus 2D echo EF 20 to 25%, no LV thrombus, no PFO On heparin IV  Ischemic limbs CT abdomen  pelvis right common iliac artery and left common femoral artery occlusion, splenic/renal infarcts Left lower extremity dry gangrene Right lower extremity coldness with limited movement and pain Vascular surgery on board Plan for bilateral AKA tomorrow On heparin IV  Sepsis/septic shock ?  Endocarditis ?  Osteomyelitis Blood culture 9/13 Staph epidermidis Blood culture 915 NGTD UA negative Hypotension BP 90s On Ancef May consider TEE once stable to rule out endocarditis  Diabetes HgbA1c 12.6 goal < 7.0 Uncontrolled Currently on insulin CBG monitoring SSI DM education and close PCP follow up  Hyperlipidemia Home meds: None LDL 74, goal < 70 Transaminitis with elevated AST/ALT 77/87 Consider statin once appropriate        Dysphagia On tube feeding @ 60 Free water 200 every 6 hours Speech on board  Other Stroke Risk Factors Advanced age  Other Active Problems Hypernatremia, resolved.  Na 155->152->149->143  Rhabdomyolysis, CK downtrending  Hospital day # 3  This patient is critically ill due to embolic stroke, PE, sepsis, LV thrombus, ischemic limbs, cardiomyopathy, bacteremia, rhabdomyolysis, and at significant risk of neurological worsening, death form septic shock, heart failure, seizure, endocarditis. This patient's care requires constant monitoring of vital signs, hemodynamics, respiratory and cardiac monitoring, review of multiple databases, neurological assessment, discussion with family, other specialists and medical decision making of high complexity. I spent 40 minutes of neurocritical care time in the care of this patient.  I discussed with vascular surgery Dr. Donzetta Matters.   Rosalin Hawking, MD PhD Stroke Neurology 06/14/2022 11:39 AM    To contact Stroke Continuity provider, please refer to [email or web address] After hours, contact General Neurology

## 2022-06-14 NOTE — Progress Notes (Signed)
NAME:  Alexander Greene, MRN:  [id number], DOB:  1956/09/29, LOS: 3 ADMISSION DATE:  06/24/2022 CONSULTATION DATE:  06/07/2022 REFERRING MD:  Walker Shadow - EDP CHIEF COMPLAINT:  AMS, severe dehydration   History of Present Illness:  66 year old man who presented to [facility] ED 9/13 after being found down at his home by a neighbor. Last seen two days PTA. No known past medical history.  On ED arrival, patient was mildly hypothermic with HR 91, BP 131/81, RR 21, SpO2 99% on RA. Appeared severely dehydrated and cachectic. Labs demonstrated CBC WNL, Na 164, Cl 126, glucose 327, renal function WNL. Mildly elevated AST/ALT and Tbili. Trop 596 > 690. VBG 7.464/44/101/32.2; UA glucose > 500, moderate Hgb, +ketones/protein. CK 2897. LA 2.3. Ethanol/UDS negative. COVID/Flu negative. CXR with mild linear R basilar opacity, likely atelectasis. CT Head/Cspine with NAICA, but motion-degraded exam. CT Chest/A/P demonstrated RLL PE, c/f small LV thombus associated with papillary muscle, small splenic/renal infarcts, R common iliac artery occlusion, L common femoral artery occlusion. VVS was consulted for evaluation. Heparin started for multiple thromboembolic issues. Patient was given Ativan for CT scans and became somnolent requiring flumazenil administration. D5 was initiated for hypernatremia (transitioned to LR). Blood cultures pending, broad spectrum vanc/cefepime initiated.   PCCM consulted for admission.  Pertinent Medical History:  No past medical history on file.  Significant Hospital Events: Including procedures, antibiotic start and stop dates in addition to other pertinent events   9/13 - Found down at home by neighbor, last seen 2 days PTA. To [facility] ED for evaluation. HyperNa, hyperglycemic, elevated CK, elevated AST/ALT; UDS negative. Multiple thomboembolic issues noted on imaging (as above). VVS consulted for ischemic LLE. 9/14 VANC/CEFE> Ancef, BC>MSSA  Interim History / Subjective:   Patient is much  more awake today.  Following commands.  Able to tell me who he is.  Where he is from.  Objective:  Blood pressure 107/74, pulse (!) 105, temperature 99.4 F (37.4 C), temperature source Axillary, resp. rate (!) 25, height 5\' 11"  (1.803 m), weight 55.1 kg, SpO2 99 %.        Intake/Output Summary (Last 24 hours) at 06/14/2022 0844 Last data filed at 06/14/2022 0800 Gross per 24 hour  Intake 3271.37 ml  Output 1150 ml  Net 2121.37 ml   Filed Weights   06/12/22 0400 06/13/22 0500 06/14/22 0500  Weight: 51.3 kg 54.8 kg 55.1 kg   Physical Examination: General: Elderly male chronically ill-appearing HEENT: NCAT tracking appropriately Heart: Distant heart tones, S1-S2, irregular Lungs: Diminished breath sounds bilaterally, no wheeze Abdomen: Mildly distended nontender Extremities: Left leg cool right leg cool, right lower extremity has sensation up to the groin left lower extremity found  Resolved Hospital Problem List:    Assessment & Plan:   Acute metabolic encephalopathy Catastrophic poly-embolic state (lung, bilateral femoral, spleen, ?brain, heart, kidneys) in setting of MSSA bacteremia; question if clot/septic or both Stress vs. Ischemic cardiomyopathy Acute rhabdomyolysis Nonviable LLE, possible nonviable RLE, sensation still intact in the right leg Profound protein calorie malnutrition Severe muscular deconditioning Likely refeeding syndrome 7 mm solid nodule in the right upper lobe  Plan: Continue on heparin drip Continue tube feeds, hold at midnight Continue free water Continue Ancef MRI brain consistent with multiple emboli Appreciate neurology input. Discussed case with Dr. Vella Redhead.  From vascular surgery. Plans n.p.o. midnight hold tube feeds with plans for revascularization of the right leg Possible AKA of the left. I suspect the patient will be even more critically ill following operative  intervention and will need prolonged ICU support.  But at this time we do  not feel that there is another option unless palliation is desired.  Dr. Vella Redhead is going to call and speak with the patient's niece.   Best Practice: (right click and "Reselect all SmartList Selections" daily)   Diet/type: TF DVT prophylaxis: systemic heparin GI prophylaxis: PPI Lines: N/A Foley:  N/A Code Status:  full code Last date of multidisciplinary goals of care discussion [see above]  This patient is critically ill with multiple organ system failure; which, requires frequent high complexity decision making, assessment, support, evaluation, and titration of therapies. This was completed through the application of advanced monitoring technologies and extensive interpretation of multiple databases. During this encounter critical care time was devoted to patient care services described in this note for 32 minutes.   [facility] 06/14/2022 10:31 AM

## 2022-06-14 NOTE — Progress Notes (Incomplete)
Daily Progress Note   Patient Name: Alexander Greene       Date: 06/14/2022 DOB: 10-Feb-1956  Age: 66 y.o. MRN#: [id number] Attending Physician: Garner Nash, DO Primary Care Physician: Patient, No Pcp Per Admit Date: 06/09/2022  Reason for Consultation/Follow-up: Establishing goals of care  Subjective: Chart review performed. Received report from primary RN - no acute concerns. RN states patient knew he was in the hospital, but overall remains confused to situation. He is still not able to make complex medical decisions.  Went to visit patient at bedside - no family/visitors present. Patient was lying in bed asleep - I did not attempt to wake him. No signs or non-verbal gestures of pain or discomfort noted. No respiratory distress, increased work of breathing, or secretions noted. He is very ill and frail appearing. Coretrak in use.  2:45 PM Attempted to call niece/[name] to discuss diagnosis, prognosis, GOC, EOL wishes, disposition, and options - no answer - confidential voicemail left and PMT phone number provided with request to return call.  2:47 PM Received return call from [facility] - emotional support provided. She is agreeable to discuss patient's current condition. She verifies speaking with Dr. Tamala Julian several days ago. Interval history since patient's admission reviewed - she understands patient is critically ill with possible surgical intervention tomorrow resulting in left AKA, or likely bilateral AKA. Reviewed his poor prognosis - current high risk for mortality, intraoperatively, as well as post-op. Reviewed that patient is not able to make medical decisions at this time. [name] expressed understanding. I gently attempted to see if she would be willing to make medical decisions on patient's  behalf - Terrence Dupont stopped responding to conversation. I asked if there was another family member or friend she was aware of, that may be willing to assist in his medical decision making, but she was no longer engaged or responding to conversation. Phone call ended.  Per conversation with Dr. Tamala Julian and my conversation with niece today, it is clear she is unwilling to make medical decisions for patient. Recommend attempting contact with patient's roommate to see if he is willing to assist with medical decision making, as he is "an individual with an established relationship with the patient who is acting in good faith and can reliably convey the wishes of the patient."   Roommate/friend contact information  located in demographics - primary RN added yesterday: [name] Lugor  Discussed case with Dr. Tonia Brooms, Dr. Randie Heinz, Dr. Phillips Odor.   Will attempt contact with roommate tomorrow to see if he would be a willing and able decision maker for patient.    Length of Stay: 3  Current Medications: Scheduled Meds:   Chlorhexidine Gluconate Cloth  6 each Topical q morning   free water  200 mL Per Tube Q6H   insulin aspart  2-6 Units Subcutaneous Q4H   insulin glargine-yfgn  10 Units Subcutaneous Daily   multivitamin with minerals  1 tablet Per Tube Daily   mouth rinse  15 mL Mouth Rinse 4 times per day   pantoprazole  40 mg Per Tube Daily   thiamine  100 mg Per Tube Daily    Continuous Infusions:   ceFAZolin (ANCEF) IV Stopped (06/14/22 0537)   feeding supplement (VITAL AF 1.2 CAL) 60 mL/hr at 06/14/22 1400   heparin 1,100 Units/hr (06/14/22 1400)   potassium PHOSPHATE IVPB (in mmol) 85 mL/hr at 06/14/22 1400    PRN Meds: acetaminophen (TYLENOL) oral liquid 160 mg/5 mL, docusate sodium, HYDROmorphone (DILAUDID) injection, mouth rinse, polyethylene glycol  Physical Exam Vitals and nursing note reviewed.  Constitutional:      General: He is not in acute distress.    Appearance: He is cachectic. He is  ill-appearing.  Pulmonary:     Effort: No respiratory distress.  Skin:    General: Skin is warm and dry.  Neurological:     Mental Status: He is alert. He is confused.     Motor: Weakness present.  Psychiatric:        Attention and Perception: He is inattentive.        Speech: Speech is delayed.        Behavior: Behavior is cooperative.        Cognition and Memory: Cognition is impaired. Memory is impaired.        Judgment: Judgment is impulsive.             Vital Signs: BP 91/70   Pulse (!) 106   Temp 98.9 F (37.2 C) (Axillary)   Resp (!) 22   Ht 5\' 11"  (1.803 m)   Wt 55.1 kg   SpO2 94%   BMI 16.94 kg/m  SpO2: SpO2: 94 % O2 Device: O2 Device: Room Air O2 Flow Rate:    Intake/output summary:  Intake/Output Summary (Last 24 hours) at 06/14/2022 1421 Last data filed at 06/14/2022 1400 Gross per 24 hour  Intake 3264.42 ml  Output 450 ml  Net 2814.42 ml   LBM: Last BM Date : 06/14/22 Baseline Weight: Weight: 68 kg Most recent weight: Weight: 55.1 kg       Palliative Assessment/Data: PPS 30% with coretrak.      Patient Active Problem List   Diagnosis Date Noted   Protein-calorie malnutrition, severe 06/12/2022   Hypernatremia    MSSA bacteremia    Acute pulmonary embolism (HCC)    LV (left ventricular) mural thrombus    Splenic infarct    Altered mental status    Femoral artery occlusion (HCC)    Acute combined systolic and diastolic heart failure [facility])    Metabolic encephalopathy 06/23/2022    Palliative Care Assessment & Plan   Patient Profile: 66 y.o. male  with unknown past medical history presented to ED on 06/27/2022 after being found down on ground for unknown period of time by roommate. Last was seen two days prior. Patient  was admitted on 06/07/2022 with acute metabolic encephalopathy in setting of hypernatremia and severe dehydration, acute rhabdomyolysis, hyperkalemia, demand cardiac ischemia, RLL PE and possible LV thrombus, left common femoral  artery occlusion, right common iliac artery occlusion, malnutrition, MSSA bacteremia. Vascular surgery was consulted for evaluation of ischemic lower extremities - patient will require iliofemoral embolectomy to the right lower extremity as well as left AKA, but is at high risk for bilateral lower extremity limb loss; needs medical stabilization prior to operative interventions. Cardiology was consulted for the evaluation of decreased LVEF of 20-25% in setting of elevated troponin - given severely decreased LV function, elevated troponin, and ECG abnormalities, patient could be considered for LHC with LVEDP evaluation once medically stable. However, in the setting of multiple acute medical issues of significant severity, urgent catheterization is not indicated and unlikely to modify patient's long-term prognosis.  Assessment: Principal Problem:   Metabolic encephalopathy Active Problems:   Hypernatremia   MSSA bacteremia   Acute pulmonary embolism (HCC)   LV (left ventricular) mural thrombus   Splenic infarct   Protein-calorie malnutrition, severe   Altered mental status   Femoral artery occlusion (HCC)   Acute combined systolic and diastolic heart failure (HCC)   Concern about end of life  Recommendations/Plan: Niece/[name] is not willing to make medical decisions on patient's behalf. Recommend seeing if his roommate would be willing to fill this role as "an individual with an established relationship with the patient who is acting in good faith and can reliably convey the wishes of the patient." Will attempt contact with him tomorrow. At this time, my medical recommendation is comfort care over surgical intervention Agree with two physician DNR to prevent unnecessary suffering or prolonging of life in this patient with overall poor prognosis, if no legal decision maker can be located PMT will continue to follow and support holistically  Goals of Care and Additional  Recommendations: Limitations on Scope of Treatment: Full Scope Treatment  Code Status:    Code Status Orders  (From admission, onward)           Start     Ordered   06/07/2022 1620  Full code  Continuous        06/10/2022 1624           Code Status History     This patient has a current code status but no historical code status.       Prognosis:  Poor  Discharge Planning: To Be Determined  Care plan was discussed with primary RN, Dr. Tonia Brooms, Dr. Randie Heinz, Dr. Phillips Odor, patient's niece   Thank you for allowing the Palliative Medicine Team to assist in the care of this patient.   Total Time 90 minutes Prolonged Time Billed  yes       Greater than 50%  of this time was spent counseling and coordinating care related to the above assessment and plan.  Haskel Khan, NP  Please contact Palliative Medicine Team phone at [phone] for questions and concerns.   *Portions of this note are a verbal dictation therefore any spelling and/or grammatical errors are due to the "[employer]agon [employer] One" system interpretation.

## 2022-06-14 NOTE — Progress Notes (Signed)
    I have been unable to reach the patient's niece today for discussion regarding prognosis and to obtain surgical informed consent.  I have visited the patient now 3 times and though he is able to participate in discussions I do not think he can make any meaningful decisions based on surgical intervention and future ramifications surgical intervention versus a palliative approach that may also include surgery.  Mr. Alexander Greene certainly has social challenges and although I am not sure those should preclude life and limb saving measures we are going to postpone surgery tomorrow morning in hopes of having more meaningful conversations with the patient and next of kin in the coming days.  I have notified the participating medical teams well as the bedside nurse, OR, and the patient himself.  Servando Snare, MD

## 2022-06-14 NOTE — Consult Note (Signed)
  Progress Note    06/14/2022 10:07 AM   Subjective: Patient is alert and answering questions this morning  Vitals:   06/14/22 0900 06/14/22 1000  BP: (!) 86/66 (!) 85/66  Pulse: (!) 105 (!) 104  Resp: (!) 21 18  Temp:    SpO2: 94% 100%    Physical Exam: Awake and alert No palpable right common femoral pulse Left lower extremity below the knee is necrotic Right lower extremity has sensation up to the level of the groin is cooler to the touch although without mottling but without sensation from the knee down  CBC    Component Value Date/Time   WBC 7.8 06/14/2022 0559   RBC 4.03 (L) 06/14/2022 0559   HGB 12.3 (L) 06/14/2022 0559   HGB 14.2 11/24/2012 1401   HCT 37.2 (L) 06/14/2022 0559   HCT 42.8 11/24/2012 1401   PLT 180 06/14/2022 0559   PLT 279 11/24/2012 1401   MCV 92.3 06/14/2022 0559   MCV 89 11/24/2012 1401   MCH 30.5 06/14/2022 0559   MCHC 33.1 06/14/2022 0559   RDW 13.3 06/14/2022 0559   RDW 14.9 (H) 11/24/2012 1401   LYMPHSABS 0.7 2022/06/22 1108   MONOABS 0.4 2022/06/22 1108   EOSABS 0.0 06-22-22 1108   BASOSABS 0.0 2022-06-22 1108    BMET    Component Value Date/Time   NA 145 06/14/2022 0559   NA 142 11/24/2012 1401   K 3.6 06/14/2022 0559   K 3.7 11/24/2012 1401   CL 107 06/14/2022 0559   CL 107 11/24/2012 1401   CO2 27 06/14/2022 0559   CO2 28 11/24/2012 1401   GLUCOSE 193 (H) 06/14/2022 0559   GLUCOSE 131 (H) 11/24/2012 1401   BUN 15 06/14/2022 0559   BUN 16 11/24/2012 1401   CREATININE 0.71 06/14/2022 0559   CREATININE 1.02 11/24/2012 1401   CALCIUM 7.3 (L) 06/14/2022 0559   CALCIUM 9.3 11/24/2012 1401   GFRNONAA >60 06/14/2022 0559    INR    Component Value Date/Time   INR 1.3 (H) 06/22/22 1548     Intake/Output Summary (Last 24 hours) at 06/14/2022 1007 Last data filed at 06/14/2022 0800 Gross per 24 hour  Intake 3151.38 ml  Output 1150 ml  Net 2001.38 ml     Assessment/plan:  66 y.o. male is here after being found  down has a grossly necrotic left lower extremity and would require above-knee amputation.  He has a cold right lower extremity with an occluded iliac and common femoral artery.  I have discussed with Dr. Valeta Harms and given patient's improved mental status we will plan to take him to the operating room tomorrow for left above-knee amputation and revascularization of the right lower extremity with 4 compartment fasciotomies.  I have attempted to contact the patient's next of kin but has been unable to make any contact thus far this morning.   Caresse Sedivy C. Donzetta Matters, MD [facility] Office: [phone] Pager: [phone]  06/14/2022 10:07 AM

## 2022-06-14 NOTE — Progress Notes (Signed)
ANTICOAGULATION CONSULT NOTE  Pharmacy Consult for heparin Indication: segmental PE + severe bilateral LE PAD in the setting of CVA   No Known Allergies  Patient Measurements: Height: 5\' 11"  (180.3 cm) Weight: 55.1 kg (121 lb 7.6 oz) IBW/kg (Calculated) : 75.3 Heparin Dosing Weight: 54.4kg  Vital Signs: Temp: 99.1 F (37.3 C) (09/16 2000) Temp Source: Axillary (09/16 2000) BP: 97/72 (09/16 2000) Pulse Rate: 106 (09/16 2000)  Labs: Recent Labs    06/12/22 0412 06/12/22 0628 06/13/22 0719 06/13/22 1217 06/14/22 0559 06/14/22 2040  HGB 14.0  --  12.5*  --  12.3*  --   HCT 45.6  --  40.5  --  37.2*  --   PLT 176  --  169  --  180  --   HEPARINUNFRC  --  0.39 0.22* 0.34 0.38 0.54  CREATININE 0.74  --  0.73  --  0.71  --   CKTOTAL 2,480*  --   --   --   --   --   TROPONINIHS  --  1,345* 640*  --   --   --      Estimated Creatinine Clearance: 71.7 mL/min (by C-G formula based on SCr of 0.71 mg/dL).     Assessment: 66 yo M  found down at his home and found to have RLL segmental PE + severe BL LE PAD + CVA.  No anticoagulation prior to admission. Pharmacy consulted for heparin.    Heparin level therapeutic at 0.54 on 1100 units/hr. Planning L AKA, RLE Revascularization w/ 4 compartment fasciotomy on 9/17. Discussed heparin goal with Neurology given CVA and extensive arterial clots. Neuro is ok with normal HL goal range given small strokes. Will aim for middle of range.   Goal of Therapy:  Heparin Level 0.3- 0.7 units/ml (aim for ~0.5) Monitor platelets by anticoagulation protocol: Yes   Plan:  Continue heparin at 1100 units/hr  F/u 8 hr HL for adjustments Monitor daily heparin level, CBC Monitor for signs/symptoms of bleeding   Erin Hearing PharmD., BCPS Clinical Pharmacist 06/14/2022 9:22 PM   Please check AMION for all [place] phone numbers After 10:00 PM, call [place] [phone]

## 2022-06-14 NOTE — Progress Notes (Addendum)
[facility] for heparin Indication: segmental PE + severe bilateral LE PAD in the setting of CVA   No Known Allergies  Patient Measurements: Height: 5\' 11"  (180.3 cm) Weight: 55.1 kg (121 lb 7.6 oz) IBW/kg (Calculated) : 75.3 Heparin Dosing Weight: 54.4kg  Vital Signs: Temp: 100.2 F (37.9 C) (09/16 0800) Temp Source: Axillary (09/16 0800) BP: 85/66 (09/16 1000) Pulse Rate: 104 (09/16 1000)  Labs: Recent Labs     0000 06/09/2022 1517 06/28/2022 1548 06/08/2022 1715 06/12/22 0412 06/12/22 0628 06/13/22 0719 06/13/22 1217 06/14/22 0559  HGB  --  14.3  --   --  14.0  --  12.5*  --  12.3*  HCT  --  42.0  --   --  45.6  --  40.5  --  37.2*  PLT   < >  --  186  --  176  --  169  --  180  APTT  --   --  25  --   --   --   --   --   --   LABPROT  --   --  16.0*  --   --   --   --   --   --   INR  --   --  1.3*  --   --   --   --   --   --   HEPARINUNFRC  --   --   --    < >  --  0.39 0.22* 0.34 0.38  CREATININE  --   --   --    < > 0.74  --  0.73  --  0.71  CKTOTAL  --   --   --   --  2,480*  --   --   --   --   TROPONINIHS  --  690*  --   --   --  1,345* 640*  --   --    < > = values in this interval not displayed.    Estimated Creatinine Clearance: 71.7 mL/min (by C-G formula based on SCr of 0.71 mg/dL).     Assessment: 66 yo M  found down at his home and found to have RLL segmental PE + severe BL LE PAD + CVA.  No anticoagulation prior to admission. Pharmacy consulted for heparin.    Heparin level 0.38 is therapeutic on 1050 units/hr. Planning L AKA, RLE Revascularization w/ 4 compartment fasciotomy on 9/17. Discussed heparin goal with Neurology given CVA and extensive arterial clots. Neuro is ok with normal HL goal range given small strokes. Will aim for middle of range.   Goal of Therapy:  Heparin Level 0.3- 0.7 units/ml (aim for ~0.5) Monitor platelets by anticoagulation protocol: Yes   Plan:  Increase heparin to 1100 units/hr   F/u 8 hr HL  Monitor daily heparin level, CBC Monitor for signs/symptoms of bleeding   Benetta Spar, PharmD, BCPS, BCCP Clinical Pharmacist  Please check AMION for all [place] phone numbers After 10:00 PM, call [place]

## 2022-06-14 NOTE — Progress Notes (Signed)
Progress Note  Patient Name: Alexander Greene Date of Encounter: 06/14/2022  Primary Cardiologist: New to [facility]  Subjective   Answers some questions this morning, still not aware of presentation and overall situation.  Knows his name and where he is from.  Inpatient Medications    Scheduled Meds:  Chlorhexidine Gluconate Cloth  6 each Topical q morning   free water  200 mL Per Tube Q6H   insulin aspart  2-6 Units Subcutaneous Q4H   insulin glargine-yfgn  10 Units Subcutaneous Daily   multivitamin with minerals  1 tablet Per Tube Daily   mouth rinse  15 mL Mouth Rinse 4 times per day   pantoprazole  40 mg Per Tube Daily   thiamine  100 mg Per Tube Daily   Continuous Infusions:   ceFAZolin (ANCEF) IV Stopped (06/14/22 0537)   feeding supplement (VITAL AF 1.2 CAL) 1,000 mL (06/14/22 1000)   heparin 1,050 Units/hr (06/14/22 0800)   potassium PHOSPHATE IVPB (in mmol) 30 mmol (06/14/22 0859)   PRN Meds: acetaminophen (TYLENOL) oral liquid 160 mg/5 mL, docusate sodium, HYDROmorphone (DILAUDID) injection, mouth rinse, polyethylene glycol   Vital Signs    Vitals:   06/14/22 0700 06/14/22 0800 06/14/22 0900 06/14/22 1000  BP: 119/81 107/74 (!) 86/66 (!) 85/66  Pulse: (!) 108 (!) 105 (!) 105 (!) 104  Resp: (!) 22 (!) 25 (!) 21 18  Temp:  100.2 F (37.9 C)    TempSrc:  Axillary    SpO2: 96% 99% 94% 100%  Weight:      Height:        Intake/Output Summary (Last 24 hours) at 06/14/2022 1039 Last data filed at 06/14/2022 0800 Gross per 24 hour  Intake 3151.38 ml  Output 1150 ml  Net 2001.38 ml   Filed Weights   06/12/22 0400 06/13/22 0500 06/14/22 0500  Weight: 51.3 kg 54.8 kg 55.1 kg    Telemetry    Sinus tachycardia with intermittent PVCs, burst of NSVT.  Personally reviewed.  ECG    An ECG dated 06/05/2022 was personally reviewed today and demonstrated:  Sinus rhythm with left atrial enlargement, anterolateral T wave inversions and increased  voltage.  Physical Exam   GEN: No acute distress.   Neck: No JVD. Cardiac: RRR with occasional ectopy, no murmur, rub, or gallop.  Respiratory: Nonlabored. Clear to auscultation bilaterally. GI: Soft, nontender, bowel sounds present. MS: No edema; No deformity. Neuro: Alert and oriented x2, does follow some commands.  Labs    Chemistry Recent Labs  Lab 06/15/2022 1108 06/26/2022 1139 06/12/22 0412 06/12/22 0628 06/13/22 0719 06/13/22 1819 06/14/22 0559  NA 164*   < > 163*   < > 152* 149* 145  K 3.6   < > 3.2*  --  2.8*  --  3.6  CL 126*   < > 121*  --  114*  --  107  CO2 26   < > 32  --  29  --  27  GLUCOSE 327*   < > 87  --  137*  --  193*  BUN 14   < > 8  --  10  --  15  CREATININE 0.80   < > 0.74  --  0.73  --  0.71  CALCIUM 8.5*   < > 8.4*  --  7.8*  --  7.3*  PROT 6.9  --   --   --   --   --   --   ALBUMIN  2.4*  --   --   --   --   --   --   AST 77*  --   --   --   --   --   --   ALT 87*  --   --   --   --   --   --   ALKPHOS 124  --   --   --   --   --   --   BILITOT 1.9*  --   --   --   --   --   --   GFRNONAA >60   < > >60  --  >60  --  >60  ANIONGAP 12   < > 10  --  9  --  11   < > = values in this interval not displayed.     Hematology Recent Labs  Lab 06/12/22 0412 06/13/22 0719 06/14/22 0559  WBC 11.7* 9.0 7.8  RBC 4.69 4.24 4.03*  HGB 14.0 12.5* 12.3*  HCT 45.6 40.5 37.2*  MCV 97.2 95.5 92.3  MCH 29.9 29.5 30.5  MCHC 30.7 30.9 33.1  RDW 13.7 13.5 13.3  PLT 176 169 180    Cardiac Enzymes Recent Labs  Lab 06/10/2022 1312 06/27/2022 1517 06/12/22 0628 06/13/22 0719  TROPONINIHS 596* 690* 1,345* 640*    DDimer Recent Labs  Lab 06/14/2022 1548  DDIMER 11.34*     Radiology    MR BRAIN WO CONTRAST  Result Date: 06/13/2022 CLINICAL DATA:  Stroke follow-up EXAM: MRI HEAD WITHOUT CONTRAST TECHNIQUE: Multiplanar, multiecho pulse sequences of the brain and surrounding structures were obtained without intravenous contrast. COMPARISON:  CT head  06/18/2022 FINDINGS: Brain: Limited exam. Patient not able to complete all sequences. Images available are degraded by motion. Restricted diffusion and acute infarct in the occipital lobes bilaterally. Small areas of acute infarct extend into the left parietal lobe and splenium of the corpus callosum. Small acute infarct right posterior frontal white matter. Gradient echo imaging not performed to evaluate for hemorrhage. Ventricle size normal. No midline shift. No significant chronic ischemic changes in the white matter. No mass lesion Vascular: Normal arterial flow voids Skull and upper cervical spine: No focal skeletal abnormality detected Sinuses/Orbits: Negative Other: None IMPRESSION: Limited study. Patient not able to complete the study and not able to hold still. Acute infarct in the occipital lobes bilaterally. This extends into the left parietal lobe and splenium. Small acute infarct right posterior frontal white matter. Electronically Signed   By: Franchot Gallo M.D.   On: 06/13/2022 11:20    Cardiac Studies   Echocardiogram 06/12/2022:  1. No mural thrombus with Definity contrast. Left ventricular ejection  fraction, by estimation, is 20 to 25%. Left ventricular ejection fraction  by 2D MOD biplane is 20.7 %. The left ventricle has severely decreased  function. The left ventricle  demonstrates global hypokinesis. There is moderate left ventricular  hypertrophy. Left ventricular diastolic parameters are indeterminate.   2. Right ventricular systolic function is mildly reduced. The right  ventricular size is normal. Tricuspid regurgitation signal is inadequate  for assessing PA pressure.   3. Left atrial size was severely dilated.   4. The mitral valve is abnormal. Trivial mitral valve regurgitation.   5. The aortic valve is tricuspid. Aortic valve regurgitation is not  visualized.   6. Aortic dilatation noted. There is borderline dilatation of the aortic  root, measuring 38 mm.   7. The  inferior vena cava is  normal in size with <50% respiratory  variability, suggesting right atrial pressure of 8 mmHg.   8. Agitated saline contrast bubble study was negative, with no evidence  of any interatrial shunt.   Assessment & Plan    1.  HFrEF of uncertain duration, LVEF 20 to 25% with global hypokinesis, no LV mural thrombus.  RV contraction mildly reduced.  2.  Severe hypernatremia presentation with rhabdomyolysis, patient found down and unresponsive, seen before that 48 hours before.  Sodium improving with supportive measures and CK trending down with hydration.  3.  Metabolic encephalopathy, also evidence of acute stroke by limited brain MRI.  Acute infarct in the occipital lobes bilaterally extending into the left parietal lobe and splenium.  Also small acute infarct in the right posterior frontal white matter.  4.  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  Seen by VVS with necrotic left lower extremity and plan for above-knee amputation.  5.  Severe protein calorie malnutrition.  6.  7 mm nodule right upper lobe.  Overall high risk situation for surgery, this is not a modifiable risk.  Per chart plan is for OR tomorrow for anticipated left AKA.  Would continue supportive measures and ICU care.  Unable to titrate GDMT at this point.  We will continue to follow.  Signed, Rozann Lesches, MD  06/14/2022, 10:39 AM

## 2022-06-15 ENCOUNTER — Inpatient Hospital Stay (HOSPITAL_COMMUNITY): Payer: Self-pay

## 2022-06-15 ENCOUNTER — Encounter (HOSPITAL_COMMUNITY): Admission: EM | Disposition: E | Payer: Self-pay | Source: Home / Self Care | Attending: Pulmonary Disease

## 2022-06-15 DIAGNOSIS — I2699 Other pulmonary embolism without acute cor pulmonale: Secondary | ICD-10-CM

## 2022-06-15 DIAGNOSIS — E871 Hypo-osmolality and hyponatremia: Secondary | ICD-10-CM

## 2022-06-15 DIAGNOSIS — I6340 Cerebral infarction due to embolism of unspecified cerebral artery: Secondary | ICD-10-CM

## 2022-06-15 DIAGNOSIS — I5020 Unspecified systolic (congestive) heart failure: Secondary | ICD-10-CM

## 2022-06-15 LAB — HEPARIN LEVEL (UNFRACTIONATED): Heparin Unfractionated: 0.35 IU/mL (ref 0.30–0.70)

## 2022-06-15 LAB — CBC
HCT: 33.6 % — ABNORMAL LOW (ref 39.0–52.0)
Hemoglobin: 11.3 g/dL — ABNORMAL LOW (ref 13.0–17.0)
MCH: 30.3 pg (ref 26.0–34.0)
MCHC: 33.6 g/dL (ref 30.0–36.0)
MCV: 90.1 fL (ref 80.0–100.0)
Platelets: 187 10*3/uL (ref 150–400)
RBC: 3.73 MIL/uL — ABNORMAL LOW (ref 4.22–5.81)
RDW: 13.3 % (ref 11.5–15.5)
WBC: 6.3 10*3/uL (ref 4.0–10.5)
nRBC: 0 % (ref 0.0–0.2)

## 2022-06-15 LAB — BASIC METABOLIC PANEL
Anion gap: 8 (ref 5–15)
BUN: 17 mg/dL (ref 8–23)
CO2: 24 mmol/L (ref 22–32)
Calcium: 7.2 mg/dL — ABNORMAL LOW (ref 8.9–10.3)
Chloride: 107 mmol/L (ref 98–111)
Creatinine, Ser: 0.63 mg/dL (ref 0.61–1.24)
GFR, Estimated: >60 mL/min (ref >60)
Glucose, Bld: 191 mg/dL — ABNORMAL HIGH (ref 70–99)
Potassium: 3.5 mmol/L (ref 3.5–5.1)
Sodium: 139 mmol/L (ref 135–145)

## 2022-06-15 LAB — GLUCOSE, CAPILLARY
Glucose-Capillary: 187 mg/dL — ABNORMAL HIGH (ref 70–99)
Glucose-Capillary: 221 mg/dL — ABNORMAL HIGH (ref 70–99)
Glucose-Capillary: 164 mg/dL — ABNORMAL HIGH (ref 70–99)
Glucose-Capillary: 139 mg/dL — ABNORMAL HIGH (ref 70–99)
Glucose-Capillary: 123 mg/dL — ABNORMAL HIGH (ref 70–99)
Glucose-Capillary: 245 mg/dL — ABNORMAL HIGH (ref 70–99)

## 2022-06-15 LAB — SODIUM: Sodium: 143 mmol/L (ref 135–145)

## 2022-06-15 LAB — VITAMIN A: Vitamin A (Retinoic Acid): 8.1 ug/dL — ABNORMAL LOW (ref 22.0–69.5)

## 2022-06-15 SURGERY — AMPUTATION, ABOVE KNEE
Anesthesia: General | Site: Knee | Laterality: Right

## 2022-06-15 NOTE — Progress Notes (Signed)
NAME:  Alexander Greene, MRN:  [id number], DOB:  Feb 04, 1956, LOS: 4 ADMISSION DATE:  06/23/2022 CONSULTATION DATE:  06/23/2022 REFERRING MD:  Walker Shadow - EDP CHIEF COMPLAINT:  AMS, severe dehydration   History of Present Illness:  66 year old man who presented to [facility] ED 9/13 after being found down at his home by a neighbor. Last seen two days PTA. No known past medical history.  On ED arrival, patient was mildly hypothermic with HR 91, BP 131/81, RR 21, SpO2 99% on RA. Appeared severely dehydrated and cachectic. Labs demonstrated CBC WNL, Na 164, Cl 126, glucose 327, renal function WNL. Mildly elevated AST/ALT and Tbili. Trop 596 > 690. VBG 7.464/44/101/32.2; UA glucose > 500, moderate Hgb, +ketones/protein. CK 2897. LA 2.3. Ethanol/UDS negative. COVID/Flu negative. CXR with mild linear R basilar opacity, likely atelectasis. CT Head/Cspine with NAICA, but motion-degraded exam. CT Chest/A/P demonstrated RLL PE, c/f small LV thombus associated with papillary muscle, small splenic/renal infarcts, R common iliac artery occlusion, L common femoral artery occlusion. VVS was consulted for evaluation. Heparin started for multiple thromboembolic issues. Patient was given Ativan for CT scans and became somnolent requiring flumazenil administration. D5 was initiated for hypernatremia (transitioned to LR). Blood cultures pending, broad spectrum vanc/cefepime initiated.   PCCM consulted for admission.  Pertinent Medical History:  No past medical history on file.  Significant Hospital Events: Including procedures, antibiotic start and stop dates in addition to other pertinent events   9/13 - Found down at home by neighbor, last seen 2 days PTA. To [facility] ED for evaluation. HyperNa, hyperglycemic, elevated CK, elevated AST/ALT; UDS negative. Multiple thomboembolic issues noted on imaging (as above). VVS consulted for ischemic LLE. 9/14 VANC/CEFE> Ancef, BC>MSSA  Interim History / Subjective:   Less awake today.   Confused.  Objective:  Blood pressure 92/67, pulse (!) 108, temperature 98.3 F (36.8 C), resp. rate (!) 24, height 5\' 11"  (1.803 m), weight 57.8 kg, SpO2 98 %.        Intake/Output Summary (Last 24 hours) at 06/20/2022 0911 Last data filed at 06/18/2022 0800 Gross per 24 hour  Intake 3339.75 ml  Output 925 ml  Net 2414.75 ml   Filed Weights   06/13/22 0500 06/14/22 0500 06/27/2022 0500  Weight: 54.8 kg 55.1 kg 57.8 kg   Physical Examination: General: Elderly chronically ill-appearing gentleman HEENT: See AT tracking appropriately Heart: Distant heart tones S1-S2 irregular Lungs: Diminished breath sounds bilaterally no wheeze Abdomen: Mild distended nontender Extremities: Both legs cool, left dry gangrenous, right cool to touch sensation above the knee  Resolved Hospital Problem List:    Assessment & Plan:   Acute metabolic encephalopathy Catastrophic poly-embolic state (lung, bilateral femoral, spleen, ?brain, heart, kidneys) in setting of MSSA bacteremia; question if clot/septic or both Stress vs. Ischemic cardiomyopathy Acute rhabdomyolysis Nonviable LLE, possible nonviable RLE, sensation still intact in the right leg Profound protein calorie malnutrition Severe muscular deconditioning Likely refeeding syndrome 7 mm solid nodule in the right upper lobe  Plan: Continue heparin drip Continue tube feeds Continue free water Continue Ancef On discussion yesterday with palliative care and vascular surgery. We will continue to support patient until we can have a final decision made. Once we clarify goals of care with family can move forward with considerations for surgery. Appreciate the input from vascular surgery and palliative. Ongoing discussions regarding quality of life following major surgery. As needed Dilaudid for pain.    Best Practice: (right click and "Reselect all SmartList Selections" daily)   Diet/type: TF  DVT prophylaxis: systemic heparin GI  prophylaxis: PPI Lines: N/A Foley:  N/A Code Status:  full code Last date of multidisciplinary goals of care discussion [see above]  This patient is critically ill with multiple organ system failure; which, requires frequent high complexity decision making, assessment, support, evaluation, and titration of therapies. This was completed through the application of advanced monitoring technologies and extensive interpretation of multiple databases. During this encounter critical care time was devoted to patient care services described in this note for 32 minutes.  [facility] 06/22/2022 9:11 AM

## 2022-06-15 NOTE — Progress Notes (Signed)
STROKE TEAM PROGRESS NOTE   SUBJECTIVE (INTERVAL HISTORY) His RN is at the bedside.  Patient eyes open, neuro unchanged, not fully orientated.  Seems not comfortable with lower extremity ischemia, vascular surgery on board, tried to contact family for surgery but not available. Surgery currently on hold. Palliative care on board.   OBJECTIVE Temp:  [98.3 F (36.8 C)-99.3 F (37.4 C)] 98.4 F (36.9 C) (09/17 1200) Pulse Rate:  [101-113] 109 (09/17 1300) Cardiac Rhythm: Sinus tachycardia (09/17 0400) Resp:  [0-26] 14 (09/17 1300) BP: (83-112)/(56-82) 112/72 (09/17 1300) SpO2:  [94 %-100 %] 98 % (09/17 1300) Weight:  [57.8 kg] 57.8 kg (09/17 0500)  Recent Labs  Lab 06/14/22 2010 06/14/22 2317 06/23/2022 0341 06/17/2022 0805 06/20/2022 1212  GLUCAP 177* 156* 187* 221* 164*   Recent Labs  Lab 06/12/22 0013 06/12/22 0412 06/12/22 0628 06/12/22 1616 06/12/22 1815 06/13/22 0719 06/13/22 1819 06/14/22 0559 06/14/22 1641 05/31/2022 0613  NA 162* 163*   < > 157*   < > 152* 149* 145 143 139  K 2.6* 3.2*  --   --   --  2.8*  --  3.6  --  3.5  CL 126* 121*  --   --   --  114*  --  107  --  107  CO2 30 32  --   --   --  29  --  27  --  24  GLUCOSE 195* 87  --   --   --  137*  --  193*  --  191*  BUN 7* 8  --   --   --  10  --  15  --  17  CREATININE 0.86 0.74  --   --   --  0.73  --  0.71  --  0.63  CALCIUM 8.0* 8.4*  --   --   --  7.8*  --  7.3*  --  7.2*  MG 1.7 2.4  --  1.9  --  1.9 2.0 1.8  --   --   PHOS  --  1.0*  --   --   --  3.1 2.1* 1.9* 2.9  --    < > = values in this interval not displayed.   Recent Labs  Lab 06/20/2022 1108  AST 77*  ALT 87*  ALKPHOS 124  BILITOT 1.9*  PROT 6.9  ALBUMIN 2.4*   Recent Labs  Lab 06/07/2022 1108 06/14/2022 1139 06/22/2022 1517 06/08/2022 1548 06/12/22 0412 06/13/22 0719 06/14/22 0559 06/10/2022 0613  WBC 10.3  --   --   --  11.7* 9.0 7.8 6.3  NEUTROABS 9.0*  --   --   --   --   --   --   --   HGB 15.8   < > 14.3  --  14.0 12.5* 12.3*  11.3*  HCT 52.8*   < > 42.0  --  45.6 40.5 37.2* 33.6*  MCV 101.1*  --   --   --  97.2 95.5 92.3 90.1  PLT 195  --   --  186 176 169 180 187   < > = values in this interval not displayed.   Recent Labs  Lab 06/13/2022 1108 06/12/22 0412  CKTOTAL 2,897* 2,480*   No results for input(s): "LABPROT", "INR" in the last 72 hours.  No results for input(s): "COLORURINE", "LABSPEC", "PHURINE", "GLUCOSEU", "HGBUR", "BILIRUBINUR", "KETONESUR", "PROTEINUR", "UROBILINOGEN", "NITRITE", "LEUKOCYTESUR" in the last 72 hours.  Invalid input(s): "APPERANCEUR"  Component Value Date/Time   CHOL 110 06/14/2022 0559   TRIG 88 06/14/2022 0559   HDL 18 (L) 06/14/2022 0559   CHOLHDL 6.1 06/14/2022 0559   VLDL 18 06/14/2022 0559   LDLCALC 74 06/14/2022 0559   Lab Results  Component Value Date   HGBA1C 12.6 (H) 06/12/2022      Component Value Date/Time   LABOPIA NONE DETECTED 05/31/2022 1450   COCAINSCRNUR NONE DETECTED 06/04/2022 1450   LABBENZ NONE DETECTED 06/10/2022 1450   AMPHETMU NONE DETECTED 06/17/2022 1450   THCU NONE DETECTED 06/08/2022 1450   LABBARB NONE DETECTED 06/10/2022 1450    Recent Labs  Lab 06/26/2022 1105  [place] <10    I have personally reviewed the radiological images below and agree with the radiology interpretations.  MR BRAIN WO CONTRAST  Result Date: 06/13/2022 CLINICAL DATA:  Stroke follow-up EXAM: MRI HEAD WITHOUT CONTRAST TECHNIQUE: Multiplanar, multiecho pulse sequences of the brain and surrounding structures were obtained without intravenous contrast. COMPARISON:  CT head 06/23/2022 FINDINGS: Brain: Limited exam. Patient not able to complete all sequences. Images available are degraded by motion. Restricted diffusion and acute infarct in the occipital lobes bilaterally. Small areas of acute infarct extend into the left parietal lobe and splenium of the corpus callosum. Small acute infarct right posterior frontal white matter. Gradient echo imaging not performed to  evaluate for hemorrhage. Ventricle size normal. No midline shift. No significant chronic ischemic changes in the white matter. No mass lesion Vascular: Normal arterial flow voids Skull and upper cervical spine: No focal skeletal abnormality detected Sinuses/Orbits: Negative Other: None IMPRESSION: Limited study. Patient not able to complete the study and not able to hold still. Acute infarct in the occipital lobes bilaterally. This extends into the left parietal lobe and splenium. Small acute infarct right posterior frontal white matter. Electronically Signed   By: Franchot Gallo M.D.   On: 06/13/2022 11:20   ECHOCARDIOGRAM COMPLETE  Result Date: 06/12/2022    ECHOCARDIOGRAM REPORT   Patient Name:   HANIF YANNUZZI Date of Exam: 06/12/2022 Medical Rec #:  [id number]          Height:       71.0 in Accession #:    [id number]         Weight:       113.1 lb Date of Birth:  27-Dec-1955          BSA:          1.655 m Patient Age:    66 years           BP:           105/76 mmHg Patient Gender: M                  HR:           96 bpm. Exam Location:  Inpatient Procedure: 2D Echo, Cardiac Doppler, Color Doppler, Intracardiac Opacification            Agent and Saline Contrast Bubble Study Indications:    Pulmonary embolus  History:        Patient has no prior history of Echocardiogram examinations.  Sonographer:    Memory Argue Referring Phys: Perry  1. No mural thrombus with Definity contrast. Left ventricular ejection fraction, by estimation, is 20 to 25%. Left ventricular ejection fraction by 2D MOD biplane is 20.7 %. The left ventricle has severely decreased function. The left ventricle demonstrates global hypokinesis. There is moderate left  ventricular hypertrophy. Left ventricular diastolic parameters are indeterminate.  2. Right ventricular systolic function is mildly reduced. The right ventricular size is normal. Tricuspid regurgitation signal is inadequate for assessing PA  pressure.  3. Left atrial size was severely dilated.  4. The mitral valve is abnormal. Trivial mitral valve regurgitation.  5. The aortic valve is tricuspid. Aortic valve regurgitation is not visualized.  6. Aortic dilatation noted. There is borderline dilatation of the aortic root, measuring 38 mm.  7. The inferior vena cava is normal in size with <50% respiratory variability, suggesting right atrial pressure of 8 mmHg.  8. Agitated saline contrast bubble study was negative, with no evidence of any interatrial shunt. Comparison(s): No prior Echocardiogram. FINDINGS  Left Ventricle: No mural thrombus with Definity contrast. Left ventricular ejection fraction, by estimation, is 20 to 25%. Left ventricular ejection fraction by 2D MOD biplane is 20.7 %. The left ventricle has severely decreased function. The left ventricle demonstrates global hypokinesis. The left ventricular internal cavity size was normal in size. There is moderate left ventricular hypertrophy. Left ventricular diastolic parameters are indeterminate. Right Ventricle: The right ventricular size is normal. No increase in right ventricular wall thickness. Right ventricular systolic function is mildly reduced. Tricuspid regurgitation signal is inadequate for assessing PA pressure. Left Atrium: Left atrial size was severely dilated. Right Atrium: Right atrial size was normal in size. Pericardium: There is no evidence of pericardial effusion. Mitral Valve: The mitral valve is abnormal. There is moderate thickening of the anterior and posterior mitral valve leaflet(s). Trivial mitral valve regurgitation. Tricuspid Valve: The tricuspid valve is grossly normal. Tricuspid valve regurgitation is trivial. Aortic Valve: The aortic valve is tricuspid. Aortic valve regurgitation is not visualized. Aortic valve mean gradient measures 4.0 mmHg. Aortic valve peak gradient measures 7.3 mmHg. Aortic valve area, by VTI measures 1.84 cm. Pulmonic Valve: The pulmonic  valve was normal in structure. Pulmonic valve regurgitation is not visualized. Aorta: Aortic dilatation noted. There is borderline dilatation of the aortic root, measuring 38 mm. Venous: The inferior vena cava is normal in size with less than 50% respiratory variability, suggesting right atrial pressure of 8 mmHg. IAS/Shunts: There is right bowing of the interatrial septum, suggestive of elevated left atrial pressure. No atrial level shunt detected by color flow Doppler. Agitated saline contrast was given intravenously to evaluate for intracardiac shunting. Agitated saline contrast bubble study was negative, with no evidence of any interatrial shunt.  LEFT VENTRICLE PLAX 2D                        Biplane EF (MOD) LVIDd:         5.50 cm         LV Biplane EF:   Left LVIDs:         5.00 cm                          ventricular LV PW:         1.30 cm                          ejection LV IVS:        1.30 cm                          fraction by LVOT diam:     2.20 cm  2D MOD LV SV:         40                               biplane is LV SV Index:   24                               20.7 %. LVOT Area:     3.80 cm                                Diastology                                LV e' medial:    7.18 cm/s LV Volumes (MOD)               LV E/e' medial:  12.6 LV vol d, MOD    157.0 ml      LV e' lateral:   5.44 cm/s A2C:                           LV E/e' lateral: 16.6 LV vol d, MOD    182.0 ml A4C: LV vol s, MOD    130.0 ml A2C: LV vol s, MOD    131.0 ml A4C: LV SV MOD A2C:   27.0 ml LV SV MOD A4C:   182.0 ml LV SV MOD BP:    35.3 ml RIGHT VENTRICLE TAPSE (M-mode): 1.8 cm LEFT ATRIUM              Index        RIGHT ATRIUM           Index LA Vol (A2C):   88.5 ml  53.48 ml/m  RA Area:     13.40 cm LA Vol (A4C):   111.0 ml 67.07 ml/m  RA Volume:   33.00 ml  19.94 ml/m LA Biplane Vol: 100.0 ml 60.43 ml/m  AORTIC VALVE AV Area (Vmax):    2.06 cm AV Area (Vmean):   2.02 cm AV Area (VTI):      1.84 cm AV Vmax:           135.00 cm/s AV Vmean:          97.900 cm/s AV VTI:            0.219 m AV Peak Grad:      7.3 mmHg AV Mean Grad:      4.0 mmHg LVOT Vmax:         73.10 cm/s LVOT Vmean:        52.000 cm/s LVOT VTI:          0.106 m LVOT/AV VTI ratio: 0.48  AORTA Ao Root diam: 3.65 cm Ao Asc diam:  3.20 cm MITRAL VALVE MV Area (PHT): 5.34 cm    SHUNTS MV Decel Time: 142 msec    Systemic VTI:  0.11 m MV E velocity: 90.40 cm/s  Systemic Diam: 2.20 cm MV A velocity: 80.10 cm/s MV E/A ratio:  1.13 Lyman Bishop MD Electronically signed by Lyman Bishop MD Signature Date/Time: 06/12/2022/1:14:27 PM    Final    DG Abd Portable 1V  Result Date: 06/12/2022 CLINICAL DATA:  Altered mental status EXAM:  PORTABLE ABDOMEN - 1 VIEW COMPARISON:  None Available. FINDINGS: Enteric tube passes into the stomach with tip overlying the pyloric region. Bowel gas pattern is unremarkable. IMPRESSION: Enteric tube within the distal stomach. Electronically Signed   By: Macy Mis M.D.   On: 06/12/2022 10:36   DG Chest Port 1 View  Result Date: 06/12/2022 CLINICAL DATA:  Shortness of breath.  Metabolic encephalopathy. EXAM: PORTABLE CHEST 1 VIEW COMPARISON:  Yesterday FINDINGS: Numerous leads and wires project over the chest. Midline trachea. Borderline cardiomegaly. No pleural effusion or pneumothorax. Clear lungs. IMPRESSION: Borderline cardiomegaly, without acute disease. Electronically Signed   By: Abigail Miyamoto M.D.   On: 06/12/2022 08:22   CT CHEST ABDOMEN PELVIS W CONTRAST  Result Date: 06/27/2022 CLINICAL DATA:  A 66 year old male presents with history of sepsis. EXAM: CT CHEST, ABDOMEN, AND PELVIS WITH CONTRAST TECHNIQUE: Multidetector CT imaging of the chest, abdomen and pelvis was performed following the standard protocol during bolus administration of intravenous contrast. RADIATION DOSE REDUCTION: This exam was performed according to the departmental dose-optimization program which includes automated  exposure control, adjustment of the mA and/or kV according to patient size and/or use of iterative reconstruction technique. CONTRAST:  43mL OMNIPAQUE IOHEXOL 350 MG/ML SOLN COMPARISON:  Cervical spine evaluation performed the same date. FINDINGS: CT CHEST FINDINGS Cardiovascular: Low-attenuation about papillary muscles near the LEFT ventricular apex raising the question of small amounts of thrombus adherent to papillary musculature in the LEFT ventricle. LEFT ventricular dilation. No LEFT atrial thrombus. Signs of RIGHT lower lobe pulmonary artery emboli in basilar segmental level branches (image 37/3) Central pulmonary vasculature is normal caliber. The aorta is normal caliber with smooth contour. Mediastinum/Nodes: No signs of adenopathy in the chest. Esophagus is grossly normal. Lungs/Pleura: No lobar consolidation. RIGHT upper lobe pulmonary nodule (image 35/5) 8 x 6 mm. Airways are patent. Small bilateral pleural effusions are present layering dependently in the chest. No pneumothorax. Musculoskeletal: See below for full musculoskeletal details. CT ABDOMEN PELVIS FINDINGS Hepatobiliary: Hepatic steatosis. Smooth hepatic contours. Patent portal vein. No suspicious hepatic lesion. No acute biliary process. Pancreas: Mild atrophy of the pancreas without ductal dilation, inflammation or visible lesion. 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 Adrenals/Urinary Tract: Adrenal glands are normal. 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 Heterogeneity in general of the nephrogram a stray shin seen bilaterally. Striated nephrogram can also be seen in the setting of pyelonephritis but there is at least 1 area that is strongly suggestive of if not diagnostic for renal infarct. Other areas also show focal characteristics either infarct or  focal nephritis. Renal veins are patent. Stomach/Bowel: No stranding adjacent to the stomach. No small bowel obstruction. Mild generalize stranding in the mesentery of the small bowel, of uncertain significance in the setting of small bilateral effusions and body wall edema. This may be developing generalized anasarca. 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 Chronicity uncertain, potentially acute/recent though with reconstitution of flow seen in superficial and deep femoral arteries distal to the site of occlusion in the upper thigh. Also however with occluded superficial femoral artery on the LEFT (image 117/3) No signs of adenopathy in the abdomen or the pelvis. Remaining branches of the abdominal aorta grossly patent this venous phase assessment. Reproductive: Prostate mild to  moderately enlarged. Urinary bladder without focal thickening or gross perivesical stranding. Other: Body wall edema.  No ascites.  No pneumoperitoneum. Musculoskeletal: No acute bone finding. No destructive bone process. Spinal degenerative changes. IMPRESSION: 1. RIGHT lower lobe pulmonary artery emboli in basilar segmental level branches. 2. 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 Given focal appearance of some of these areas would suggest 3 to six-month follow-up to ensure no underlying lesion is present. 3. 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 Findings are suspicious for recent and or acute occlusion superimposed on chronic vascular disease. 4. Focal occlusion of LEFT common femoral artery with little contrast seen within  distal branches in keeping with areas of thromboembolic disease. Vascular surgery consultation is suggested. 5. 7 mm right solid pulmonary nodule within the upper lobe. Per Fleischner Society Guidelines, recommend a non-contrast Chest CT at 6-12 months. If patient is high risk for malignancy, recommend an additional non-contrast Chest CT at 18-24 months; if patient is low risk for malignancy a non-contrast Chest CT at 18-24 months is optional. These guidelines do not apply to immunocompromised patients and patients with cancer. Follow up in patients with significant comorbidities as clinically warranted. For lung cancer screening, adhere to Lung-RADS guidelines. Reference: Radiology. 2017; 284(1):228-43. Critical Value/emergent results were called by telephone at the time of interpretation on 06/24/2022 at 3:01 pm to provider [facility] , who verbally acknowledged these results. Electronically Signed   By: Zetta Bills M.D.   On: 06/28/2022 15:01   CT Head Wo Contrast  Result Date: 06/24/2022 CLINICAL DATA:  Altered mental status, neck injury. EXAM: CT HEAD WITHOUT CONTRAST CT CERVICAL SPINE WITHOUT CONTRAST TECHNIQUE: Multidetector CT imaging of the head and cervical spine was performed following the standard protocol without intravenous contrast. Multiplanar CT image reconstructions of the cervical spine were also generated. Unfortunately, exam is limited due to patient motion artifact. RADIATION DOSE REDUCTION: This exam was performed according to the departmental dose-optimization program which includes automated exposure control, adjustment of the mA and/or kV according to patient size and/or use of iterative reconstruction technique. COMPARISON:  April 05, 2021. FINDINGS: CT HEAD FINDINGS Brain: No evidence of acute infarction, hemorrhage, hydrocephalus, extra-axial collection or mass lesion/mass effect. Vascular: No hyperdense vessel or unexpected calcification. Skull: Normal. Negative for fracture or  focal lesion. Sinuses/Orbits: No acute finding. Other: None. CT CERVICAL SPINE FINDINGS Alignment: Normal. Skull base and vertebrae: Only the first 6 cervical vertebra are well visualized due to patient motion artifact. No fracture or bony abnormality is involving these. Pathology involving C7 cannot be excluded on the basis of this exam. Soft tissues and spinal canal: No prevertebral fluid or swelling seen in visualized areas. No visible canal hematoma. Disc levels:  Moderate degenerative disc disease is noted at C3-4. Upper chest: Not visualized due to patient motion artifact. Other: None. IMPRESSION: No definite acute intracranial abnormality seen. Due to patient motion artifact, only the first 6 of cervical vertebra are well visualized. No fracture or spondylolisthesis is seen involving these visualized vertebra. Fracture or other pathology involving C7 vertebra cannot be excluded on the basis of this exam. Electronically Signed   By: Marijo Conception M.D.   On: 06/01/2022 13:21   CT  Cervical Spine Wo Contrast  Result Date: 06/23/2022 CLINICAL DATA:  Altered mental status, neck injury. EXAM: CT HEAD WITHOUT CONTRAST CT CERVICAL SPINE WITHOUT CONTRAST TECHNIQUE: Multidetector CT imaging of the head and cervical spine was performed following the standard protocol without intravenous contrast. Multiplanar CT image reconstructions of the cervical spine were also generated. Unfortunately, exam is limited due to patient motion artifact. RADIATION DOSE REDUCTION: This exam was performed according to the departmental dose-optimization program which includes automated exposure control, adjustment of the mA and/or kV according to patient size and/or use of iterative reconstruction technique. COMPARISON:  April 05, 2021. FINDINGS: CT HEAD FINDINGS Brain: No evidence of acute infarction, hemorrhage, hydrocephalus, extra-axial collection or mass lesion/mass effect. Vascular: No hyperdense vessel or unexpected calcification.  Skull: Normal. Negative for fracture or focal lesion. Sinuses/Orbits: No acute finding. Other: None. CT CERVICAL SPINE FINDINGS Alignment: Normal. Skull base and vertebrae: Only the first 6 cervical vertebra are well visualized due to patient motion artifact. No fracture or bony abnormality is involving these. Pathology involving C7 cannot be excluded on the basis of this exam. Soft tissues and spinal canal: No prevertebral fluid or swelling seen in visualized areas. No visible canal hematoma. Disc levels:  Moderate degenerative disc disease is noted at C3-4. Upper chest: Not visualized due to patient motion artifact. Other: None. IMPRESSION: No definite acute intracranial abnormality seen. Due to patient motion artifact, only the first 6 of cervical vertebra are well visualized. No fracture or spondylolisthesis is seen involving these visualized vertebra. Fracture or other pathology involving C7 vertebra cannot be excluded on the basis of this exam. Electronically Signed   By: Marijo Conception M.D.   On: 06/25/2022 13:21   DG Chest Port 1 View  Result Date: 06/05/2022 : Questionable sepsis - evaluate for abnormality EXAM: PORTABLE CHEST 1 VIEW COMPARISON:  Chest x-ray 04/05/2021. FINDINGS: Mild linear right basilar opacity. No confluent consolidation. No visible pleural effusions pneumothorax pre cardiomediastinal silhouette is within normal limits when accounting for technique soft were taken. IMPRESSION: Mild linear right basilar opacity, favor subsegmental atelectasis. No confluent consolidation. Electronically Signed   By: Margaretha Sheffield M.D.   On: 06/06/2022 11:41     PHYSICAL EXAM  Temp:  [98.3 F (36.8 C)-99.3 F (37.4 C)] 98.4 F (36.9 C) (09/17 1200) Pulse Rate:  [101-113] 109 (09/17 1300) Resp:  [0-26] 14 (09/17 1300) BP: (83-112)/(56-82) 112/72 (09/17 1300) SpO2:  [94 %-100 %] 98 % (09/17 1300) Weight:  [57.8 kg] 57.8 kg (09/17 0500)  General - Well nourished, well developed, in no  apparent distress.  Ophthalmologic - fundi not visualized due to noncooperation.  Cardiovascular - Regular rhythm and rate.  Neuro - awake, alert, eyes open, not orientated to age, place, or time. No aphasia but severe dysarthia and paucity of speech, following most simple commands. Able to name 2/2 and repeat simple sentence in dysarthric voice. No gaze palsy but bilateral gaze incomplete, not tracking, not blinking to visual threat bilaterally. No significant facial droop. Tongue midline. Bilateral UEs 3+/5, no drift. LLE gangrene below the knee, cold, slight muscle contraction on request not against gravity. RLE cold below the knee, about 3-/5 slightly against gravity. Sensation not cooperative, b/l FTN b/l dysmetria left more than right, gait not tested.     ASSESSMENT/PLAN Mr. KIRKLAN BUENO is a 66 y.o. male with unclear medical history admitted for unresponsiveness, found to have hyponatremia, hypothermia, rhabdomyolysis, AKI.   Stroke:  bilateral PCA infarcts, small left MCA and right  MCA/ACA infarcts, embolic pattern likely due to LV thrombus, cardiomyopathy with low EF, PE if PFO present, or endocarditis. CT head no acute abnormality MRI brain acute infarct in the occipital lobes bilaterally, this extends into the left parietal lobe and splenium, small acute infarct right posterior frontal white matter. MRA pending Carotid Doppler pending 2D Echo EF 20 to 25%, no LV thrombus LE venous Doppler pending LDL 74 HgbA1c 12.6 UDS negative Heparin IV for VTE prophylaxis No antithrombotic prior to admission, now on heparin IV.  Ongoing aggressive stroke risk factor management Therapy recommendations: Pending Disposition: Pending, palliative care on board trying to find a family  PE CT chest showed right lower lobe PE On heparin IV TCD bubble study pending LE venous Doppler pending  LV thrombus Cardiomyopathy CT chest showed LV thrombus 2D echo EF 20 to 25%, no LV thrombus,  no PFO Cardiology on board On heparin IV  Ischemic limbs CT abdomen pelvis right common iliac artery and left common femoral artery occlusion, splenic/renal infarcts Left lower extremity dry gangrene Right lower extremity coldness with limited movement and pain Vascular surgery on board Plan for bilateral AKA once able to get consent On heparin IV  Sepsis/septic shock ?  Endocarditis ?  Osteomyelitis Blood culture 9/13 Staph epidermidis Blood culture 915 NGTD UA negative Bone biopsy pending Hypotension BP 90s->100s/110s, improved On Ancef May consider TEE once stable to rule out endocarditis  Diabetes HgbA1c 12.6 goal < 7.0 Uncontrolled Currently on insulin CBG monitoring SSI DM education and close PCP follow up  Hyperlipidemia Home meds: None LDL 74, goal < 70 Transaminitis with elevated AST/ALT 77/87->pending Consider statin once appropriate        Dysphagia On tube feeding @ 60 Free water 200 every 6 hours Speech on board  Other Stroke Risk Factors Advanced age  Other Active Problems Hypernatremia, resolved.  Na 155->152->149->143->139 Rhabdomyolysis, CK downtrending  Hospital day # 4  This patient is critically ill due to embolic stroke, PE, sepsis, LV thrombus, ischemic limbs, cardiomyopathy, bacteremia, rhabdomyolysis, and at significant risk of neurological worsening, death form septic shock, heart failure, seizure, endocarditis. This patient's care requires constant monitoring of vital signs, hemodynamics, respiratory and cardiac monitoring, review of multiple databases, neurological assessment, discussion with family, other specialists and medical decision making of high complexity. I spent 40 minutes of neurocritical care time in the care of this patient.    Rosalin Hawking, MD PhD Stroke Neurology 06/16/2022 2:02 PM    To contact Stroke Continuity provider, please refer to [email or web address] After hours, contact General Neurology

## 2022-06-15 NOTE — Progress Notes (Addendum)
Daily Progress Note   Patient Name: Alexander Greene       Date: 2022-07-01 DOB: 1955/10/23  Age: 66 y.o. MRN#: [id number] Attending Physician: Garner Nash, DO Primary Care Physician: Patient, No Pcp Per Admit Date: 06/10/2022  Reason for Consultation/Follow-up: Establishing goals of care  Subjective: Discussed case with Dr. Valeta Harms.  Chart review performed. Received report from primary RN - no acute concerns. RN reports patient has been agitated, restless, lethargic.  Went to visit patient at bedside - no family/visitors present. Patient was lying in bed asleep - he does wake to voice/gentle touch; however, eyes remain closed during interaction. His speech is mumbled and slurred. Signs and non-verbal gestures of pain/discomfort noted - moaning, restless, agitated mood. He is in restrains and mitts. Coretrak in use. Patient asks multiple times for restrains to be removed. He is oriented to self only today; states the year is 2035, does not know where he is or situation.  11:52 AM Attempted to call roommate/[name] to discuss diagnosis, prognosis, GOC, EOL wishes, disposition, options, and assess if he is willing/able to be medical decision maker for patient - no answer - unable to leave confidential voicemail.  1:01 PM Attempted to call roommate/[name] - no answer - unable to leave confidential voicemail.  3:41 PM Attempted to call roommate/[name] - was able to speak with him. Emotional support provided. He has known patient for 10 years. They used to be roommates but he very recently moved out. Interval history since patient's admission reviewed - patient is critically ill due to embolic stroke, PE, sepsis, LV thrombus, ischemic limbs, cardiomyopathy, bacteremia, rhabdomyolysis, and at  significant risk of neurological worsening, death form septic shock, heart failure, seizure, endocarditis. He understands patient's acute medical situation and medical team's concern about his overall poor prognosis. He understands patient will die without surgery/AKAs; however, in setting of multiple acute medical issues of significant severity, even if surgery was performed patient remains at high mortality risk, would likely require PEG tube, would be total care requiring LTC placement, high risk for recurrent hospitalizations if he even survived hospital admission. Medical recommendation for transition to comfort care over surgical intervention was given. Reviewed with [name] medical team's lack of legal decision maker for patient - update provided that niece/[name] was unwilling to make medical decisions on patient's behalf as well  as our unsuccessful attempts at locating other family members. Discussed with [name] possibility of him being patient's medical decision maker, if he was willing. He is open to being decision maker for patient; however, wants to try and speak with patient's Doristine Bosworth in attempt to locate any other of patient's family prior to accepting role. PMT number given - he will notify us if other family are located. He is open for PMT to follow up tomorrow.   All questions and concerns addressed. Encouraged to call with questions/concerns.  Length of Stay: 4  Current Medications: Scheduled Meds:   Chlorhexidine Gluconate Cloth  6 each Topical q morning   free water  200 mL Per Tube Q6H   insulin aspart  2-6 Units Subcutaneous Q4H   insulin glargine-yfgn  10 Units Subcutaneous Daily   multivitamin with minerals  1 tablet Per Tube Daily   mouth rinse  15 mL Mouth Rinse 4 times per day   pantoprazole  40 mg Per Tube Daily   thiamine  100 mg Per Tube Daily    Continuous Infusions:   ceFAZolin (ANCEF) IV Stopped (06/05/2022 0549)   feeding supplement (VITAL AF 1.2 CAL) 60 mL/hr at  06/28/2022 1100   heparin 1,100 Units/hr (06/16/2022 1100)    PRN Meds: acetaminophen (TYLENOL) oral liquid 160 mg/5 mL, docusate sodium, HYDROmorphone (DILAUDID) injection, mouth rinse, polyethylene glycol  Physical Exam Vitals and nursing note reviewed.  Constitutional:      General: He is not in acute distress.    Appearance: He is cachectic. He is ill-appearing.  Pulmonary:     Effort: No respiratory distress.  Skin:    General: Skin is warm and dry.  Neurological:     Mental Status: He is alert. He is disoriented and confused.     Motor: Weakness present.  Psychiatric:        Attention and Perception: He is inattentive.        Speech: Speech is delayed.        Behavior: Behavior is agitated.        Cognition and Memory: Cognition is impaired. Memory is impaired.        Judgment: Judgment is impulsive.             Vital Signs: BP 110/67   Pulse (!) 112   Temp 98.3 F (36.8 C)   Resp 17   Ht 5\' 11"  (1.803 m)   Wt 57.8 kg   SpO2 95%   BMI 17.77 kg/m  SpO2: SpO2: 95 % O2 Device: O2 Device: Room Air O2 Flow Rate:    Intake/output summary:  Intake/Output Summary (Last 24 hours) at 06/22/2022 1155 Last data filed at 06/21/2022 1100 Gross per 24 hour  Intake 2932.82 ml  Output 1200 ml  Net 1732.82 ml   LBM: Last BM Date : 06/14/22 Baseline Weight: Weight: 68 kg Most recent weight: Weight: 57.8 kg       Palliative Assessment/Data: PPS 30% with tube feeds      Patient Active Problem List   Diagnosis Date Noted   Protein-calorie malnutrition, severe 06/12/2022   Hypernatremia    MSSA bacteremia    Acute pulmonary embolism (HCC)    LV (left ventricular) mural thrombus    Splenic infarct    Altered mental status    Femoral artery occlusion (HCC)    Acute combined systolic and diastolic heart failure ([place])    Metabolic encephalopathy [id number]    Palliative Care Assessment & Plan   Patient Profile:  66 y.o. male  with unknown past medical history  presented to ED on 06/13/2022 after being found down on ground for unknown period of time by roommate. Last was seen two days prior. Patient was admitted on 06/10/2022 with acute metabolic encephalopathy in setting of hypernatremia and severe dehydration, acute rhabdomyolysis, hyperkalemia, demand cardiac ischemia, RLL PE and possible LV thrombus, left common femoral artery occlusion, right common iliac artery occlusion, malnutrition, MSSA bacteremia. Vascular surgery was consulted for evaluation of ischemic lower extremities - patient will require iliofemoral embolectomy to the right lower extremity as well as left AKA, but is at high risk for bilateral lower extremity limb loss; needs medical stabilization prior to operative interventions. Cardiology was consulted for the evaluation of decreased LVEF of 20-25% in setting of elevated troponin - given severely decreased LV function, elevated troponin, and ECG abnormalities, patient could be considered for LHC with LVEDP evaluation once medically stable. However, in the setting of multiple acute medical issues of significant severity, urgent catheterization is not indicated and unlikely to modify patient's long-term prognosis.  Assessment: Principal Problem:   Metabolic encephalopathy Active Problems:   Hypernatremia   MSSA bacteremia   Acute pulmonary embolism (HCC)   LV (left ventricular) mural thrombus   Splenic infarct   Protein-calorie malnutrition, severe   Altered mental status   Femoral artery occlusion (HCC)   Acute combined systolic and diastolic heart failure (HCC)   Concern about end of life  Recommendations/Plan: Patient's friend is open to being medical decision maker; however, wants to reach out to patient's Doristine Bosworth in attempt to locate other family prior to accepting role Since friend has known patient for 10 years, I do feel he is "an individual with an established relationship with the patient who is acting in good faith and can  reliably convey the wishes of the patient."  At this time, my medical recommendation is comfort care over surgical intervention PMT will continue to follow and support holistically  Goals of Care and Additional Recommendations: Limitations on Scope of Treatment: Full Scope Treatment  Code Status:    Code Status Orders  (From admission, onward)           Start     Ordered   06/18/2022 1620  Full code  Continuous        06/16/2022 1624           Code Status History     This patient has a current code status but no historical code status.       Prognosis:  Poor  Discharge Planning: To Be Determined  Care plan was discussed with primary RN, patient's friend, Dr. Valeta Harms, Dr. Hilma Favors, Dr. Donzetta Matters  Thank you for allowing the Palliative Medicine Team to assist in the care of this patient.   Total Time 90 minutes Prolonged Time Billed  yes       Greater than 50%  of this time was spent counseling and coordinating care related to the above assessment and plan.  Lin Landsman, NP  Please contact Palliative Medicine Team phone at [phone] for questions and concerns.    *Portions of this note are a verbal dictation therefore any spelling and/or grammatical errors are due to the "[employer] [employer]" system interpretation.

## 2022-06-15 NOTE — Progress Notes (Signed)
[facility] for heparin Indication: segmental PE + severe bilateral LE PAD in the setting of CVA   No Known Allergies  Patient Measurements: Height: 5\' 11"  (180.3 cm) Weight: 57.8 kg (127 lb 6.8 oz) IBW/kg (Calculated) : 75.3 Heparin Dosing Weight: 54.4kg  Vital Signs: Temp: 98.3 F (36.8 C) (09/17 0800) Temp Source: Axillary (09/17 0000) BP: 110/67 (09/17 1100) Pulse Rate: 112 (09/17 1100)  Labs: Recent Labs    06/13/22 0719 06/13/22 1217 06/14/22 0559 06/14/22 2040 05/31/2022 0613  HGB 12.5*  --  12.3*  --  11.3*  HCT 40.5  --  37.2*  --  33.6*  PLT 169  --  180  --  187  HEPARINUNFRC 0.22*   < > 0.38 0.54 0.35  CREATININE 0.73  --  0.71  --  0.63  TROPONINIHS 640*  --   --   --   --    < > = values in this interval not displayed.    Estimated Creatinine Clearance: 75.3 mL/min (by C-G formula based on SCr of 0.63 mg/dL).     Assessment: 66 yo M  found down at his home and found to have RLL segmental PE + severe BL LE PAD + CVA.  No anticoagulation prior to admission. Pharmacy consulted for heparin.    9/16 Discussed heparin goal with Neurology given CVA and extensive arterial clots. Neuro is ok with normal HL goal range given small strokes. Will aim for middle of goal range.   Heparin level 0.35 is therapeutic on 1100 units/hr, down from 0.54 last night on same rate. Planning L AKA, RLE Revascularization w/ 4 compartment fasciotomy when able to get consent.  Goal of Therapy:  Heparin Level 0.3- 0.7 units/ml (aim for ~0.5 if able) Monitor platelets by anticoagulation protocol: Yes   Plan:  Continue heparin to 1100 units/hr  Monitor daily heparin level, CBC Monitor for signs/symptoms of bleeding   Benetta Spar, PharmD, BCPS, BCCP Clinical Pharmacist  Please check AMION for all [facility] phone numbers After 10:00 PM, call [facility]

## 2022-06-15 NOTE — Progress Notes (Signed)
Lower extremity venous bilateral, carotid duplex, and TCD w/ bubble study completed.   Please see CV Proc for preliminary results.   Darlin Coco, RDMS, RVT

## 2022-06-15 NOTE — Progress Notes (Signed)
Progress Note  Patient Name: Alexander Greene Date of Encounter: 06/12/2022  Primary Cardiologist: New to [facility]  Subjective   Mental status less clear today, confused with questions.  Inpatient Medications    Scheduled Meds:  Chlorhexidine Gluconate Cloth  6 each Topical q morning   free water  200 mL Per Tube Q6H   insulin aspart  2-6 Units Subcutaneous Q4H   insulin glargine-yfgn  10 Units Subcutaneous Daily   multivitamin with minerals  1 tablet Per Tube Daily   mouth rinse  15 mL Mouth Rinse 4 times per day   pantoprazole  40 mg Per Tube Daily   thiamine  100 mg Per Tube Daily   Continuous Infusions:   ceFAZolin (ANCEF) IV Stopped (06/05/2022 0549)   feeding supplement (VITAL AF 1.2 CAL) 60 mL/hr at 06/09/2022 1000   heparin 1,100 Units/hr (06/22/2022 1000)   PRN Meds: acetaminophen (TYLENOL) oral liquid 160 mg/5 mL, docusate sodium, HYDROmorphone (DILAUDID) injection, mouth rinse, polyethylene glycol   Vital Signs    Vitals:   06/14/2022 0800 06/14/2022 0900 06/20/2022 1000 06/05/2022 1005  BP: 94/79 92/67 98/61    Pulse: (!) 105 (!) 108 (!) 103   Resp: (!) 26 (!) 24 (!) 23 (!) 22  Temp: 98.3 F (36.8 C)     TempSrc:      SpO2: 97% 98% 98%   Weight:      Height:        Intake/Output Summary (Last 24 hours) at 06/28/2022 1028 Last data filed at 06/02/2022 1000 Gross per 24 hour  Intake 3481.67 ml  Output 1200 ml  Net 2281.67 ml   Filed Weights   06/13/22 0500 06/14/22 0500 06/21/2022 0500  Weight: 54.8 kg 55.1 kg 57.8 kg    Telemetry    Sinus tachycardia with ventricular ectopy.  Personally reviewed.  ECG    An ECG dated 06/17/2022 was personally reviewed today and demonstrated:  Sinus rhythm with left atrial enlargement, anterolateral T wave inversions and increased voltage.  Physical Exam   GEN: No acute distress.   Neck: No JVD. Cardiac: RRR with ectopy, no murmur or gallop.  Respiratory: Nonlabored. Clear to auscultation bilaterally. GI:  Soft, nontender, bowel sounds present. MS: No edema.  Right leg cool from the knee down, left leg with gangrenous changes. Neuro: Alert and oriented x1.  Labs    Chemistry Recent Labs  Lab 06/25/2022 1108 06/18/2022 1139 06/13/22 0719 06/13/22 1819 06/14/22 0559 06/14/22 1641 06/14/2022 0613  NA 164*   < > 152*   < > 145 143 139  K 3.6   < > 2.8*  --  3.6  --  3.5  CL 126*   < > 114*  --  107  --  107  CO2 26   < > 29  --  27  --  24  GLUCOSE 327*   < > 137*  --  193*  --  191*  BUN 14   < > 10  --  15  --  17  CREATININE 0.80   < > 0.73  --  0.71  --  0.63  CALCIUM 8.5*   < > 7.8*  --  7.3*  --  7.2*  PROT 6.9  --   --   --   --   --   --   ALBUMIN 2.4*  --   --   --   --   --   --   AST 77*  --   --   --   --   --   --  ALT 87*  --   --   --   --   --   --   ALKPHOS 124  --   --   --   --   --   --   BILITOT 1.9*  --   --   --   --   --   --   GFRNONAA >60   < > >60  --  >60  --  >60  ANIONGAP 12   < > 9  --  11  --  8   < > = values in this interval not displayed.     Hematology Recent Labs  Lab 06/13/22 0719 06/14/22 0559 06/16/2022 0613  WBC 9.0 7.8 6.3  RBC 4.24 4.03* 3.73*  HGB 12.5* 12.3* 11.3*  HCT 40.5 37.2* 33.6*  MCV 95.5 92.3 90.1  MCH 29.5 30.5 30.3  MCHC 30.9 33.1 33.6  RDW 13.5 13.3 13.3  PLT 169 180 187    Cardiac Enzymes Recent Labs  Lab 06-24-2022 1312 2022-06-24 1517 06/12/22 0628 06/13/22 0719  TROPONINIHS 596* 690* 1,345* 640*    DDimer Recent Labs  Lab 06-24-22 1548  DDIMER 11.34*     Radiology    MR BRAIN WO CONTRAST  Result Date: 06/13/2022 CLINICAL DATA:  Stroke follow-up EXAM: MRI HEAD WITHOUT CONTRAST TECHNIQUE: Multiplanar, multiecho pulse sequences of the brain and surrounding structures were obtained without intravenous contrast. COMPARISON:  CT head 06-24-2022 FINDINGS: Brain: Limited exam. Patient not able to complete all sequences. Images available are degraded by motion. Restricted diffusion and acute infarct in the  occipital lobes bilaterally. Small areas of acute infarct extend into the left parietal lobe and splenium of the corpus callosum. Small acute infarct right posterior frontal white matter. Gradient echo imaging not performed to evaluate for hemorrhage. Ventricle size normal. No midline shift. No significant chronic ischemic changes in the white matter. No mass lesion Vascular: Normal arterial flow voids Skull and upper cervical spine: No focal skeletal abnormality detected Sinuses/Orbits: Negative Other: None IMPRESSION: Limited study. Patient not able to complete the study and not able to hold still. Acute infarct in the occipital lobes bilaterally. This extends into the left parietal lobe and splenium. Small acute infarct right posterior frontal white matter. Electronically Signed   By: Franchot Gallo M.D.   On: 06/13/2022 11:20    Cardiac Studies   Echocardiogram 06/12/2022:  1. No mural thrombus with Definity contrast. Left ventricular ejection  fraction, by estimation, is 20 to 25%. Left ventricular ejection fraction  by 2D MOD biplane is 20.7 %. The left ventricle has severely decreased  function. The left ventricle  demonstrates global hypokinesis. There is moderate left ventricular  hypertrophy. Left ventricular diastolic parameters are indeterminate.   2. Right ventricular systolic function is mildly reduced. The right  ventricular size is normal. Tricuspid regurgitation signal is inadequate  for assessing PA pressure.   3. Left atrial size was severely dilated.   4. The mitral valve is abnormal. Trivial mitral valve regurgitation.   5. The aortic valve is tricuspid. Aortic valve regurgitation is not  visualized.   6. Aortic dilatation noted. There is borderline dilatation of the aortic  root, measuring 38 mm.   7. The inferior vena cava is normal in size with <50% respiratory  variability, suggesting right atrial pressure of 8 mmHg.   8. Agitated saline contrast bubble study was  negative, with no evidence  of any interatrial shunt.   Assessment & Plan  1.  HFrEF of uncertain duration, LVEF 20 to 25% with global hypokinesis, no LV mural thrombus.  RV contraction mildly reduced.  2.  Severe hypernatremia at presentation with rhabdomyolysis, patient found down and unresponsive.  Sodium now normal and CK trending down with hydration.  3.  Metabolic encephalopathy, also evidence of acute stroke by limited brain MRI.  Acute infarct in the occipital lobes bilaterally extending into the left parietal lobe and splenium.  Also small acute infarct in the right posterior frontal white matter.  4.  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  Seen by [facility] with necrotic left lower extremity also poor flow on the right.  Felt to ultimately need left above-the-knee amputation and potentially right above-the-knee amputation.  He would be very high risk for surgery given current situation.  This is largely not a modifiable risk.  5.  Severe protein calorie malnutrition.  6.  7 mm nodule right upper lobe.  Continuing supportive measures per CCM with follow-up by VVS.  He would be very high risk for vascular surgery and this is largely not a modifiable risk.  No family contacts made as yet.  He was seen by palliative care this past Friday as well.  Overall poor prognosis.  Signed, Rozann Lesches, MD  06/22/2022, 10:28 AM

## 2022-06-15 NOTE — Progress Notes (Signed)
  Progress Note    06/18/2022 10:10 AM * No surgery date entered *  Subjective: Patient is less coherent today  Vitals:   06/10/2022 0900 06/13/2022 1000  BP: 92/67   Pulse: (!) 108 (!) 103  Resp: (!) 24 (!) 23  Temp:    SpO2: 98% 98%    Physical Exam: Not oriented to person place or time on my evaluation today Nonlabored respirations His right leg is cool from the knee down does not appear mottled, left leg with stable gangrenous changes No right femoral pulse present  CBC    Component Value Date/Time   WBC 6.3 06/18/2022 0613   RBC 3.73 (L) 06/23/2022 0613   HGB 11.3 (L) 06/06/2022 0613   HGB 14.2 11/24/2012 1401   HCT 33.6 (L) 06/28/2022 0613   HCT 42.8 11/24/2012 1401   PLT 187 06/14/2022 0613   PLT 279 11/24/2012 1401   MCV 90.1 06/01/2022 0613   MCV 89 11/24/2012 1401   MCH 30.3 06/08/2022 0613   MCHC 33.6 06/09/2022 0613   RDW 13.3 06/27/2022 0613   RDW 14.9 (H) 11/24/2012 1401   LYMPHSABS 0.7 06/09/2022 1108   MONOABS 0.4 06/04/2022 1108   EOSABS 0.0 05/30/2022 1108   BASOSABS 0.0 June 15, 2022 1108    BMET    Component Value Date/Time   NA 139 06/14/2022 0613   NA 142 11/24/2012 1401   K 3.5 06/18/2022 0613   K 3.7 11/24/2012 1401   CL 107 06/01/2022 0613   CL 107 11/24/2012 1401   CO2 24 06/01/2022 0613   CO2 28 11/24/2012 1401   GLUCOSE 191 (H) 06/14/2022 0613   GLUCOSE 131 (H) 11/24/2012 1401   BUN 17 06/06/2022 0613   BUN 16 11/24/2012 1401   CREATININE 0.63 06/17/2022 0613   CREATININE 1.02 11/24/2012 1401   CALCIUM 7.2 (L) 06/18/2022 0613   CALCIUM 9.3 11/24/2012 1401   GFRNONAA >60 06/13/2022 0613    INR    Component Value Date/Time   INR 1.3 (H) 06/23/2022 1548     Intake/Output Summary (Last 24 hours) at 06/25/2022 1010 Last data filed at 06/22/2022 1000 Gross per 24 hour  Intake 3481.67 ml  Output 1200 ml  Net 2281.67 ml     Assessment/plan:  66 y.o. 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  At this point patient would need left above-knee amputation and probably will require right above-knee amputation as well although this will be difficult to heal given his occlusion of the right common iliac artery and would likely need iliofemoral thrombectomy.  We have failed multiple attempts to contact next of kin for goals of care discussion.  Hopefully in the next day or so this can be performed.    Donisha Hoch C. Donzetta Matters, MD [facility] Office: [phone] Pager: [phone]  06/20/2022 10:10 AM

## 2022-06-16 DIAGNOSIS — I82452 Acute embolism and thrombosis of left peroneal vein: Secondary | ICD-10-CM

## 2022-06-16 LAB — CBC
HCT: 31.7 % — ABNORMAL LOW (ref 39.0–52.0)
Hemoglobin: 10.4 g/dL — ABNORMAL LOW (ref 13.0–17.0)
MCH: 30.2 pg (ref 26.0–34.0)
MCHC: 32.8 g/dL (ref 30.0–36.0)
MCV: 92.2 fL (ref 80.0–100.0)
Platelets: 195 10*3/uL (ref 150–400)
RBC: 3.44 MIL/uL — ABNORMAL LOW (ref 4.22–5.81)
RDW: 13.4 % (ref 11.5–15.5)
WBC: 8 10*3/uL (ref 4.0–10.5)
nRBC: 0 % (ref 0.0–0.2)

## 2022-06-16 LAB — GLUCOSE, CAPILLARY
Glucose-Capillary: 184 mg/dL — ABNORMAL HIGH (ref 70–99)
Glucose-Capillary: 245 mg/dL — ABNORMAL HIGH (ref 70–99)
Glucose-Capillary: 233 mg/dL — ABNORMAL HIGH (ref 70–99)
Glucose-Capillary: 232 mg/dL — ABNORMAL HIGH (ref 70–99)
Glucose-Capillary: 192 mg/dL — ABNORMAL HIGH (ref 70–99)

## 2022-06-16 LAB — BASIC METABOLIC PANEL
Anion gap: 9 (ref 5–15)
BUN: 16 mg/dL (ref 8–23)
CO2: 28 mmol/L (ref 22–32)
Calcium: 7.6 mg/dL — ABNORMAL LOW (ref 8.9–10.3)
Chloride: 103 mmol/L (ref 98–111)
Creatinine, Ser: 0.76 mg/dL (ref 0.61–1.24)
GFR, Estimated: >60 mL/min (ref >60)
Glucose, Bld: 217 mg/dL — ABNORMAL HIGH (ref 70–99)
Potassium: 3.7 mmol/L (ref 3.5–5.1)
Sodium: 140 mmol/L (ref 135–145)

## 2022-06-16 LAB — HEPATIC FUNCTION PANEL
ALT: 28 U/L (ref 0–44)
AST: 118 U/L — ABNORMAL HIGH (ref 15–41)
Albumin: <1.5 g/dL — ABNORMAL LOW (ref 3.5–5.0)
Alkaline Phosphatase: 172 U/L — ABNORMAL HIGH (ref 38–126)
Bilirubin, Direct: 0.2 mg/dL (ref 0.0–0.2)
Indirect Bilirubin: 0.5 mg/dL (ref 0.3–0.9)
Total Bilirubin: 0.7 mg/dL (ref 0.3–1.2)
Total Protein: 5.4 g/dL — ABNORMAL LOW (ref 6.5–8.1)

## 2022-06-16 LAB — HEPARIN LEVEL (UNFRACTIONATED): Heparin Unfractionated: 0.31 IU/mL (ref 0.30–0.70)

## 2022-06-16 LAB — VITAMIN C: Vitamin C: <0.1 mg/dL — ABNORMAL LOW (ref 0.4–2.0)

## 2022-06-16 LAB — SODIUM: Sodium: 138 mmol/L (ref 135–145)

## 2022-06-16 MED ORDER — POTASSIUM CHLORIDE 20 MEQ PO PACK
40.0000 meq | PACK | Freq: Once | ORAL | Status: AC
  Administered 2022-06-16: 40 meq
  Filled 2022-06-16: qty 2

## 2022-06-16 MED ORDER — INSULIN ASPART 100 UNIT/ML IJ SOLN
3.0000 [IU] | INTRAMUSCULAR | Status: DC
  Administered 2022-06-16 – 2022-06-17 (×4): 3 [IU] via SUBCUTANEOUS

## 2022-06-16 NOTE — Progress Notes (Signed)
Pt admitted to 3w4 at this time.

## 2022-06-16 NOTE — Progress Notes (Addendum)
  Progress Note    06/16/2022 8:25 AM * No surgery date entered *  Subjective:  alert but not oriented   Vitals:   06/16/22 0500 06/16/22 0600  BP: 96/70 103/77  Pulse: (!) 111 (!) 110  Resp: 18 20  Temp:    SpO2: 100% 100%   Physical Exam: Lungs:  non labored Extremities:  malodorous necrotic L leg; R foot cold to touch Neurologic: alert but not oriented  CBC    Component Value Date/Time   WBC 8.0 06/16/2022 0609   RBC 3.44 (L) 06/16/2022 0609   HGB 10.4 (L) 06/16/2022 0609   HGB 14.2 11/24/2012 1401   HCT 31.7 (L) 06/16/2022 0609   HCT 42.8 11/24/2012 1401   PLT 195 06/16/2022 0609   PLT 279 11/24/2012 1401   MCV 92.2 06/16/2022 0609   MCV 89 11/24/2012 1401   MCH 30.2 06/16/2022 0609   MCHC 32.8 06/16/2022 0609   RDW 13.4 06/16/2022 0609   RDW 14.9 (H) 11/24/2012 1401   LYMPHSABS 0.7 06/08/2022 1108   MONOABS 0.4 06/02/2022 1108   EOSABS 0.0 06/04/2022 1108   BASOSABS 0.0 06/20/2022 1108    BMET    Component Value Date/Time   NA 140 06/16/2022 0609   NA 142 11/24/2012 1401   K 3.7 06/16/2022 0609   K 3.7 11/24/2012 1401   CL 103 06/16/2022 0609   CL 107 11/24/2012 1401   CO2 28 06/16/2022 0609   CO2 28 11/24/2012 1401   GLUCOSE 217 (H) 06/16/2022 0609   GLUCOSE 131 (H) 11/24/2012 1401   BUN 16 06/16/2022 0609   BUN 16 11/24/2012 1401   CREATININE 0.76 06/16/2022 0609   CREATININE 1.02 11/24/2012 1401   CALCIUM 7.6 (L) 06/16/2022 0609   CALCIUM 9.3 11/24/2012 1401   GFRNONAA >60 06/16/2022 0609    INR    Component Value Date/Time   INR 1.3 (H) 06/08/2022 1548     Intake/Output Summary (Last 24 hours) at 06/16/2022 0825 Last data filed at 06/16/2022 0600 Gross per 24 hour  Intake 2461.63 ml  Output 1140 ml  Net 1321.63 ml     Assessment/Plan:  66 y.o. male with BLE ischemia  Patient will require BLE above the knee amputations based on physical exam A roommate was going to reach out to his pastor to help with decision making;  pending this discussion patient will be transitioned to comfort care versus proceeding with B AKA and R iliofemoral thrombectomy   Dagoberto Ligas, PA-C [facility] [phone] 06/16/2022 8:25 AM  VASCULAR STAFF ADDENDUM: I have independently interviewed and examined the patient. I agree with the above.  Significant LLE necrosis, now malodorous.  Pt without capacity.  Surgery would involve left BKA, right iliofemoral embolectomy, likely AKA. With current clinical status recommend palliative care.   Cassandria Santee, MD [facility] Phone Number: [phone] 06/16/2022 8:41 AM

## 2022-06-16 NOTE — Inpatient Diabetes Management (Signed)
Inpatient Diabetes Program Recommendations  AACE/ADA: New Consensus Statement on Inpatient Glycemic Control (2015)  Target Ranges:  Prepandial:   less than 140 mg/dL      Peak postprandial:   less than 180 mg/dL (1-2 hours)      Critically ill patients:  140 - 180 mg/dL   Lab Results  Component Value Date   GLUCAP 233 (H) 06/16/2022   HGBA1C 12.6 (H) 06/12/2022    Review of Glycemic Control  Latest Reference Range & Units 06/16/2022 08:05 06/12/2022 12:12 06/27/2022 15:57 06/20/2022 19:36 05/30/2022 23:19 06/16/22 03:29 06/16/22 07:55 06/16/22 11:07  Glucose-Capillary 70 - 99 mg/dL 221 (H) 164 (H) 139 (H) 123 (H) 245 (H) 184 (H) 245 (H) 233 (H)   Diabetes history: no previous history   Current orders for Inpatient glycemic control:  Semglee 10 units Daily Novolog 2-6 units Q4 hours  A1c 12.6% on 9/14 Potential goals of care discussions Vital AF 60 ml/hr  Inpatient Diabetes Program Recommendations:    -   If in the plan of care consider Novolog 2 units Q4 hours tube feed coverage.  Thanks,  Tama Headings RN, MSN, BC-ADM Inpatient Diabetes Coordinator Team Pager [phone] (8a-5p)

## 2022-06-16 NOTE — Progress Notes (Signed)
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   No Known Allergies  Patient Measurements: Height: 5\' 11"  (180.3 cm) Weight: 57.8 kg (127 lb 6.8 oz) IBW/kg (Calculated) : 75.3 Heparin Dosing Weight: 54.4kg  Vital Signs: Temp: 99.6 F (37.6 C) (09/18 0800) Temp Source: Axillary (09/18 0800) BP: 103/77 (09/18 0600) Pulse Rate: 110 (09/18 0600)  Labs: Recent Labs    06/14/22 0559 06/14/22 2040 06/01/2022 0613 06/16/22 0609  HGB 12.3*  --  11.3* 10.4*  HCT 37.2*  --  33.6* 31.7*  PLT 180  --  187 195  HEPARINUNFRC 0.38 0.54 0.35 0.31  CREATININE 0.71  --  0.63 0.76    Estimated Creatinine Clearance: 75.3 mL/min (by C-G formula based on SCr of 0.76 mg/dL).   Assessment: 66 yo M  found down at his home and found to have RLL segmental PE + severe BL LE PAD + CVA.  No anticoagulation prior to admission. Pharmacy consulted for heparin.    9/16 Discussed heparin goal with Neurology given CVA and extensive arterial clots. Neuro is ok with normal HL goal range given small strokes. Will aim for middle of goal range.   Heparin level 0.31 is therapeutic on 1100 units/hr. No administration issues or bleeding concerns reported. CBC stable.   Goal of Therapy:  Heparin Level 0.3- 0.7 units/ml (aim for ~0.5 if able) Monitor platelets by anticoagulation protocol: Yes   Plan:  Continue heparin 1100 units/hr  Monitor daily heparin level, CBC Monitor for signs/symptoms of bleeding   Erskine Speed, PharmD Clinical Pharmacist

## 2022-06-16 NOTE — Progress Notes (Signed)
CSW notes palliative care's attempts to find decision maker for patient as niece not willing at this time. Will continue to follow.   Gilmore Laroche, MSW, [facility]

## 2022-06-16 NOTE — Progress Notes (Addendum)
STROKE TEAM PROGRESS NOTE   SUBJECTIVE (INTERVAL HISTORY)  Vascular surgery recommending palliative care. Complex family/POA dynamic- unable to obtain consent for surgery  Patient eyes open, neuro unchanged, not fully orientated, will respond to voice.  CCM to transfer out of ICU today. Cortrak in place, mitts on bilaterally.  Surgery currently on hold. Palliative care on board.   OBJECTIVE Temp:  [97.9 F (36.6 C)-101.2 F (38.4 C)] 99.6 F (37.6 C) (09/18 0800) Pulse Rate:  [106-116] 110 (09/18 0600) Cardiac Rhythm: Sinus tachycardia (09/17 2000) Resp:  [11-33] 20 (09/18 0600) BP: (90-112)/(59-81) 103/77 (09/18 0600) SpO2:  [85 %-100 %] 100 % (09/18 0600) Weight:  [57.8 kg] 57.8 kg (09/18 0445)  Recent Labs  Lab 06/20/2022 1557 06/20/2022 1936 06/10/2022 2319 06/16/22 0329 06/16/22 0755  GLUCAP 139* 123* 245* 184* 245*    Recent Labs  Lab 06/12/22 0412 06/12/22 0628 06/12/22 1616 06/12/22 1815 06/13/22 0719 06/13/22 1819 06/14/22 0559 06/14/22 1641 06/26/2022 0613 06/24/2022 1755 06/16/22 0609  NA 163*   < > 157*   < > 152* 149* 145 143 139 143 140  K 3.2*  --   --   --  2.8*  --  3.6  --  3.5  --  3.7  CL 121*  --   --   --  114*  --  107  --  107  --  103  CO2 32  --   --   --  29  --  27  --  24  --  28  GLUCOSE 87  --   --   --  137*  --  193*  --  191*  --  217*  BUN 8  --   --   --  10  --  15  --  17  --  16  CREATININE 0.74  --   --   --  0.73  --  0.71  --  0.63  --  0.76  CALCIUM 8.4*  --   --   --  7.8*  --  7.3*  --  7.2*  --  7.6*  MG 2.4  --  1.9  --  1.9 2.0 1.8  --   --   --   --   PHOS 1.0*  --   --   --  3.1 2.1* 1.9* 2.9  --   --   --    < > = values in this interval not displayed.    Recent Labs  Lab 06/14/2022 1108 06/16/22 0609  AST 77* 118*  ALT 87* 28  ALKPHOS 124 172*  BILITOT 1.9* 0.7  PROT 6.9 5.4*  ALBUMIN 2.4* <1.5*    Recent Labs  Lab 06/20/2022 1108 06/18/2022 1139 06/12/22 0412 06/13/22 0719 06/14/22 0559 06/25/2022 0613  06/16/22 0609  WBC 10.3  --  11.7* 9.0 7.8 6.3 8.0  NEUTROABS 9.0*  --   --   --   --   --   --   HGB 15.8   < > 14.0 12.5* 12.3* 11.3* 10.4*  HCT 52.8*   < > 45.6 40.5 37.2* 33.6* 31.7*  MCV 101.1*  --  97.2 95.5 92.3 90.1 92.2  PLT 195   < > 176 169 180 187 195   < > = values in this interval not displayed.    Recent Labs  Lab 06/16/2022 1108 06/12/22 0412  CKTOTAL 2,897* 2,480*    No results for input(s): "LABPROT", "INR" in the last 72  hours.  No results for input(s): "COLORURINE", "LABSPEC", "PHURINE", "GLUCOSEU", "HGBUR", "BILIRUBINUR", "KETONESUR", "PROTEINUR", "UROBILINOGEN", "NITRITE", "LEUKOCYTESUR" in the last 72 hours.  Invalid input(s): "APPERANCEUR"      Component Value Date/Time   CHOL 110 06/14/2022 0559   TRIG 88 06/14/2022 0559   HDL 18 (L) 06/14/2022 0559   CHOLHDL 6.1 06/14/2022 0559   VLDL 18 06/14/2022 0559   LDLCALC 74 06/14/2022 0559   Lab Results  Component Value Date   HGBA1C 12.6 (H) 06/12/2022      Component Value Date/Time   LABOPIA NONE DETECTED 06/20/2022 1450   COCAINSCRNUR NONE DETECTED 05/30/2022 1450   LABBENZ NONE DETECTED 06/21/2022 1450   AMPHETMU NONE DETECTED 06/07/2022 1450   THCU NONE DETECTED 06/12/2022 1450   LABBARB NONE DETECTED 06/20/2022 1450    Recent Labs  Lab 06/04/2022 1105  ETH <10     I have personally reviewed the radiological images below and agree with the radiology interpretations.  VAS [place] TRANSCRANIAL DOPPLER W BUBBLES  Result Date: 06/27/2022  Transcranial Doppler with Bubble Patient Name:  Alexander Greene  Date of Exam:   06/03/2022 Medical Rec #: [id number]           Accession #:    [id number] Date of Birth: 09/04/1956           Patient Gender: M Patient Age:   66 years Exam Location:  [facility] Procedure:      VAS [place] TRANSCRANIAL DOPPLER W BUBBLES Referring Phys: Cornelius Moras Lesslie Mossa --------------------------------------------------------------------------------  Indications: Stroke. Limitations:  Constant patient movement. Patient unable to follow              commands/perform valsalva. Comparison Study: No prior studies. Performing Technologist: Darlin Coco RDMS, RVT  Examination Guidelines: A complete evaluation includes B-mode imaging, spectral Doppler, color Doppler, and power Doppler as needed of all accessible portions of each vessel. Bilateral testing is considered an integral part of a complete examination. Limited examinations for reoccurring indications may be performed as noted.  Summary: No HITS at rest or during Valsalva. Negative transcranial Doppler Bubble study with no evidence of right to left intracardiac communication.  A vascular evaluation was performed. The left middle cerebral artery was studied. An IV was inserted into the patient's left forearm. Verbal informed consent was obtained.  No HITS observed at rest. Howver, not cooperative with valsalva maneuver. with current observation, negative transcranial Doppler Bubble study with no evidence of right to left intracardiac communication. *See table(s) above for TCD measurements and observations.  Diagnosing physician: Rosalin Hawking MD Electronically signed by Rosalin Hawking MD on 06/13/2022 at 6:23:58 PM.    Final    VAS US CAROTID  Result Date: 06/24/2022 Carotid Arterial Duplex Study Patient Name:  Alexander Greene  Date of Exam:   06/10/2022 Medical Rec #: [id number]           Accession #:    [id number] Date of Birth: 1955-10-27           Patient Gender: M Patient Age:   66 years Exam Location:  [facility] Procedure:      VAS US CAROTID Referring Phys: Alferd Patee [facility] --------------------------------------------------------------------------------  Indications:       CVA. Risk Factors:      None listed. Limitations        Today's exam was limited due to the patient's inability or                    unwillingness to cooperate.  Comparison Study:  No prior studies. Performing Technologist: Darlin Coco RDMS, RVT  Examination  Guidelines: A complete evaluation includes B-mode imaging, spectral Doppler, color Doppler, and power Doppler as needed of all accessible portions of each vessel. Bilateral testing is considered an integral part of a complete examination. Limited examinations for reoccurring indications may be performed as noted.  Right Carotid Findings: +----------+--------+--------+--------+------------------+--------+           PSV cm/sEDV cm/sStenosisPlaque DescriptionComments +----------+--------+--------+--------+------------------+--------+ CCA Prox  62      22                                         +----------+--------+--------+--------+------------------+--------+ CCA Distal60      21                                         +----------+--------+--------+--------+------------------+--------+ ICA Prox  61      31      1-39%   heterogenous               +----------+--------+--------+--------+------------------+--------+ ICA Distal68      32                                         +----------+--------+--------+--------+------------------+--------+ ECA       72      15                                         +----------+--------+--------+--------+------------------+--------+ +----------+--------+-------+----------------+-------------------+           PSV cm/sEDV cmsDescribe        Arm Pressure (mmHG) +----------+--------+-------+----------------+-------------------+ [id number]            Multiphasic, WNL                    +----------+--------+-------+----------------+-------------------+ +---------+--------+--+--------+--+---------+ VertebralPSV cm/s38EDV cm/s13Antegrade +---------+--------+--+--------+--+---------+  Left Carotid Findings: +----------+--------+--------+--------+-----------------------+--------+           PSV cm/sEDV cm/sStenosisPlaque Description     Comments +----------+--------+--------+--------+-----------------------+--------+ CCA Prox   55      17                                              +----------+--------+--------+--------+-----------------------+--------+ CCA Distal75      24              smooth and heterogenous         +----------+--------+--------+--------+-----------------------+--------+ ICA Prox  58      25      1-39%   heterogenous                    +----------+--------+--------+--------+-----------------------+--------+ ICA Distal64      33                                              +----------+--------+--------+--------+-----------------------+--------+ ECA       61  15                                              +----------+--------+--------+--------+-----------------------+--------+ +----------+--------+--------+----------------+-------------------+           PSV cm/sEDV cm/sDescribe        Arm Pressure (mmHG) +----------+--------+--------+----------------+-------------------+ [id number]             Multiphasic, WNL                    +----------+--------+--------+----------------+-------------------+ +---------+--------+--+--------+--+---------+ VertebralPSV cm/s51EDV cm/s24Antegrade +---------+--------+--+--------+--+---------+   Summary: Right Carotid: Velocities in the right ICA are consistent with a 1-39% stenosis. Left Carotid: Velocities in the left ICA are consistent with a 1-39% stenosis. Vertebrals:  Bilateral vertebral arteries demonstrate antegrade flow. Subclavians: Normal flow hemodynamics were seen in bilateral subclavian              arteries. *See table(s) above for measurements and observations.  Electronically signed by Rosalin Hawking MD on 06/03/2022 at 6:21:52 PM.    Final    VAS [place] LOWER EXTREMITY VENOUS (DVT)  Result Date: 06/26/2022  Lower Venous DVT Study Patient Name:  Alexander Greene  Date of Exam:   05/31/2022 Medical Rec #: [id number]           Accession #:    [id number] Date of Birth: August 06, 1956           Patient Gender: M Patient Age:    62 years Exam Location:  [facility] Procedure:      VAS [place] LOWER EXTREMITY VENOUS (DVT) Referring Phys: Cornelius Moras Shanna Un --------------------------------------------------------------------------------  Indications: Pulmonary embolism.  Limitations: Gangrenous left lower extremity with bandaging. Comparison Study: No prior studies. Performing Technologist: Darlin Coco RDMS, RVT  Examination Guidelines: A complete evaluation includes B-mode imaging, spectral Doppler, color Doppler, and power Doppler as needed of all accessible portions of each vessel. Bilateral testing is considered an integral part of a complete examination. Limited examinations for reoccurring indications may be performed as noted. The reflux portion of the exam is performed with the patient in reverse Trendelenburg.  +---------+---------------+---------+-----------+----------+--------------+ RIGHT    CompressibilityPhasicitySpontaneityPropertiesThrombus Aging +---------+---------------+---------+-----------+----------+--------------+ CFV      Full           Yes      Yes                                 +---------+---------------+---------+-----------+----------+--------------+ SFJ      Full                                                        +---------+---------------+---------+-----------+----------+--------------+ FV Prox  Full                                                        +---------+---------------+---------+-----------+----------+--------------+ FV Mid   Full                                                        +---------+---------------+---------+-----------+----------+--------------+  FV DistalFull                                                        +---------+---------------+---------+-----------+----------+--------------+ PFV      Full                                                        +---------+---------------+---------+-----------+----------+--------------+ POP       Full           Yes      Yes                                 +---------+---------------+---------+-----------+----------+--------------+ PTV      Full                                                        +---------+---------------+---------+-----------+----------+--------------+ PERO     Full                                                        +---------+---------------+---------+-----------+----------+--------------+   +---------+---------------+---------+-----------+----------+--------------+ LEFT     CompressibilityPhasicitySpontaneityPropertiesThrombus Aging +---------+---------------+---------+-----------+----------+--------------+ CFV      Full           Yes      Yes                                 +---------+---------------+---------+-----------+----------+--------------+ SFJ      Full                                                        +---------+---------------+---------+-----------+----------+--------------+ FV Prox  Full                                                        +---------+---------------+---------+-----------+----------+--------------+ FV Mid   Full                                                        +---------+---------------+---------+-----------+----------+--------------+ FV DistalFull                                                        +---------+---------------+---------+-----------+----------+--------------+  PFV      Full                                                        +---------+---------------+---------+-----------+----------+--------------+ POP      None           No       No                   Acute          +---------+---------------+---------+-----------+----------+--------------+ PTV      None           No       No                   Acute          +---------+---------------+---------+-----------+----------+--------------+ PERO     None           No       No                    Acute          +---------+---------------+---------+-----------+----------+--------------+     Summary: RIGHT: - There is no evidence of deep vein thrombosis in the lower extremity.  - No cystic structure found in the popliteal fossa.  LEFT: - Findings consistent with acute deep vein thrombosis involving the left popliteal vein, left posterior tibial veins, and left peroneal veins. - No cystic structure found in the popliteal fossa.  *See table(s) above for measurements and observations.    Preliminary    MR BRAIN WO CONTRAST  Result Date: 06/13/2022 CLINICAL DATA:  Stroke follow-up EXAM: MRI HEAD WITHOUT CONTRAST TECHNIQUE: Multiplanar, multiecho pulse sequences of the brain and surrounding structures were obtained without intravenous contrast. COMPARISON:  CT head 06/18/2022 FINDINGS: Brain: Limited exam. Patient not able to complete all sequences. Images available are degraded by motion. Restricted diffusion and acute infarct in the occipital lobes bilaterally. Small areas of acute infarct extend into the left parietal lobe and splenium of the corpus callosum. Small acute infarct right posterior frontal white matter. Gradient echo imaging not performed to evaluate for hemorrhage. Ventricle size normal. No midline shift. No significant chronic ischemic changes in the white matter. No mass lesion Vascular: Normal arterial flow voids Skull and upper cervical spine: No focal skeletal abnormality detected Sinuses/Orbits: Negative Other: None IMPRESSION: Limited study. Patient not able to complete the study and not able to hold still. Acute infarct in the occipital lobes bilaterally. This extends into the left parietal lobe and splenium. Small acute infarct right posterior frontal white matter. Electronically Signed   By: Franchot Gallo M.D.   On: 06/13/2022 11:20   ECHOCARDIOGRAM COMPLETE  Result Date: 06/12/2022    ECHOCARDIOGRAM REPORT   Patient Name:   Alexander Greene Date of Exam: 06/12/2022 Medical  Rec #:  [id number]          Height:       71.0 in Accession #:    [id number]         Weight:       113.1 lb Date of Birth:  07-31-1956          BSA:          1.655 m Patient Age:    48 years  BP:           105/76 mmHg Patient Gender: M                  HR:           96 bpm. Exam Location:  Inpatient Procedure: 2D Echo, Cardiac Doppler, Color Doppler, Intracardiac Opacification            Agent and Saline Contrast Bubble Study Indications:    Pulmonary embolus  History:        Patient has no prior history of Echocardiogram examinations.  Sonographer:    Memory Argue Referring Phys: [place]  1. No mural thrombus with Definity contrast. Left ventricular ejection fraction, by estimation, is 20 to 25%. Left ventricular ejection fraction by 2D MOD biplane is 20.7 %. The left ventricle has severely decreased function. The left ventricle demonstrates global hypokinesis. There is moderate left ventricular hypertrophy. Left ventricular diastolic parameters are indeterminate.  2. Right ventricular systolic function is mildly reduced. The right ventricular size is normal. Tricuspid regurgitation signal is inadequate for assessing PA pressure.  3. Left atrial size was severely dilated.  4. The mitral valve is abnormal. Trivial mitral valve regurgitation.  5. The aortic valve is tricuspid. Aortic valve regurgitation is not visualized.  6. Aortic dilatation noted. There is borderline dilatation of the aortic root, measuring 38 mm.  7. The inferior vena cava is normal in size with <50% respiratory variability, suggesting right atrial pressure of 8 mmHg.  8. Agitated saline contrast bubble study was negative, with no evidence of any interatrial shunt. Comparison(s): No prior Echocardiogram. FINDINGS  Left Ventricle: No mural thrombus with Definity contrast. Left ventricular ejection fraction, by estimation, is 20 to 25%. Left ventricular ejection fraction by 2D MOD biplane is 20.7 %. The left  ventricle has severely decreased function. The left ventricle demonstrates global hypokinesis. The left ventricular internal cavity size was normal in size. There is moderate left ventricular hypertrophy. Left ventricular diastolic parameters are indeterminate. Right Ventricle: The right ventricular size is normal. No increase in right ventricular wall thickness. Right ventricular systolic function is mildly reduced. Tricuspid regurgitation signal is inadequate for assessing PA pressure. Left Atrium: Left atrial size was severely dilated. Right Atrium: Right atrial size was normal in size. Pericardium: There is no evidence of pericardial effusion. Mitral Valve: The mitral valve is abnormal. There is moderate thickening of the anterior and posterior mitral valve leaflet(s). Trivial mitral valve regurgitation. Tricuspid Valve: The tricuspid valve is grossly normal. Tricuspid valve regurgitation is trivial. Aortic Valve: The aortic valve is tricuspid. Aortic valve regurgitation is not visualized. Aortic valve mean gradient measures 4.0 mmHg. Aortic valve peak gradient measures 7.3 mmHg. Aortic valve area, by VTI measures 1.84 cm. Pulmonic Valve: The pulmonic valve was normal in structure. Pulmonic valve regurgitation is not visualized. Aorta: Aortic dilatation noted. There is borderline dilatation of the aortic root, measuring 38 mm. Venous: The inferior vena cava is normal in size with less than 50% respiratory variability, suggesting right atrial pressure of 8 mmHg. IAS/Shunts: There is right bowing of the interatrial septum, suggestive of elevated left atrial pressure. No atrial level shunt detected by color flow Doppler. Agitated saline contrast was given intravenously to evaluate for intracardiac shunting. Agitated saline contrast bubble study was negative, with no evidence of any interatrial shunt.  LEFT VENTRICLE PLAX 2D  Biplane EF (MOD) LVIDd:         5.50 cm         LV Biplane EF:    Left LVIDs:         5.00 cm                          ventricular LV PW:         1.30 cm                          ejection LV IVS:        1.30 cm                          fraction by LVOT diam:     2.20 cm                          2D MOD LV SV:         40                               biplane is LV SV Index:   24                               20.7 %. LVOT Area:     3.80 cm                                Diastology                                LV e' medial:    7.18 cm/s LV Volumes (MOD)               LV E/e' medial:  12.6 LV vol d, MOD    157.0 ml      LV e' lateral:   5.44 cm/s A2C:                           LV E/e' lateral: 16.6 LV vol d, MOD    182.0 ml A4C: LV vol s, MOD    130.0 ml A2C: LV vol s, MOD    131.0 ml A4C: LV SV MOD A2C:   27.0 ml LV SV MOD A4C:   182.0 ml LV SV MOD BP:    35.3 ml RIGHT VENTRICLE TAPSE (M-mode): 1.8 cm LEFT ATRIUM              Index        RIGHT ATRIUM           Index LA Vol (A2C):   88.5 ml  53.48 ml/m  RA Area:     13.40 cm LA Vol (A4C):   111.0 ml 67.07 ml/m  RA Volume:   33.00 ml  19.94 ml/m LA Biplane Vol: 100.0 ml 60.43 ml/m  AORTIC VALVE AV Area (Vmax):    2.06 cm AV Area (Vmean):   2.02 cm AV Area (VTI):     1.84 cm AV Vmax:           135.00 cm/s AV Vmean:  97.900 cm/s AV VTI:            0.219 m AV Peak Grad:      7.3 mmHg AV Mean Grad:      4.0 mmHg LVOT Vmax:         73.10 cm/s LVOT Vmean:        52.000 cm/s LVOT VTI:          0.106 m LVOT/AV VTI ratio: 0.48  AORTA Ao Root diam: 3.65 cm Ao Asc diam:  3.20 cm MITRAL VALVE MV Area (PHT): 5.34 cm    SHUNTS MV Decel Time: 142 msec    Systemic VTI:  0.11 m MV E velocity: 90.40 cm/s  Systemic Diam: 2.20 cm MV A velocity: 80.10 cm/s MV E/A ratio:  1.13 Lyman Bishop MD Electronically signed by Lyman Bishop MD Signature Date/Time: 06/12/2022/1:14:27 PM    Final    DG Abd Portable 1V  Result Date: 06/12/2022 CLINICAL DATA:  Altered mental status EXAM: PORTABLE ABDOMEN - 1 VIEW COMPARISON:  None Available.  FINDINGS: Enteric tube passes into the stomach with tip overlying the pyloric region. Bowel gas pattern is unremarkable. IMPRESSION: Enteric tube within the distal stomach. Electronically Signed   By: Macy Mis M.D.   On: 06/12/2022 10:36   DG Chest Port 1 View  Result Date: 06/12/2022 CLINICAL DATA:  Shortness of breath.  Metabolic encephalopathy. EXAM: PORTABLE CHEST 1 VIEW COMPARISON:  Yesterday FINDINGS: Numerous leads and wires project over the chest. Midline trachea. Borderline cardiomegaly. No pleural effusion or pneumothorax. Clear lungs. IMPRESSION: Borderline cardiomegaly, without acute disease. Electronically Signed   By: Abigail Miyamoto M.D.   On: 06/12/2022 08:22   CT CHEST ABDOMEN PELVIS W CONTRAST  Result Date: 06/16/2022 CLINICAL DATA:  A 66 year old male presents with history of sepsis. EXAM: CT CHEST, ABDOMEN, AND PELVIS WITH CONTRAST TECHNIQUE: Multidetector CT imaging of the chest, abdomen and pelvis was performed following the standard protocol during bolus administration of intravenous contrast. RADIATION DOSE REDUCTION: This exam was performed according to the departmental dose-optimization program which includes automated exposure control, adjustment of the mA and/or kV according to patient size and/or use of iterative reconstruction technique. CONTRAST:  48mL OMNIPAQUE IOHEXOL 350 MG/ML SOLN COMPARISON:  Cervical spine evaluation performed the same date. FINDINGS: CT CHEST FINDINGS Cardiovascular: Low-attenuation about papillary muscles near the LEFT ventricular apex raising the question of small amounts of thrombus adherent to papillary musculature in the LEFT ventricle. LEFT ventricular dilation. No LEFT atrial thrombus. Signs of RIGHT lower lobe pulmonary artery emboli in basilar segmental level branches (image 37/3) Central pulmonary vasculature is normal caliber. The aorta is normal caliber with smooth contour. Mediastinum/Nodes: No signs of adenopathy in the chest. Esophagus  is grossly normal. Lungs/Pleura: No lobar consolidation. RIGHT upper lobe pulmonary nodule (image 35/5) 8 x 6 mm. Airways are patent. Small bilateral pleural effusions are present layering dependently in the chest. No pneumothorax. Musculoskeletal: See below for full musculoskeletal details. CT ABDOMEN PELVIS FINDINGS Hepatobiliary: Hepatic steatosis. Smooth hepatic contours. Patent portal vein. No suspicious hepatic lesion. No acute biliary process. Pancreas: Mild atrophy of the pancreas without ductal dilation, inflammation or visible lesion. 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 Adrenals/Urinary Tract: Adrenal glands are normal. 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 Heterogeneity in general of the nephrogram a stray shin  seen bilaterally. Striated nephrogram can also be seen in the setting of pyelonephritis but there is at least 1 area that is strongly suggestive of if not diagnostic for renal infarct. Other areas also show focal characteristics either infarct or focal nephritis. Renal veins are patent. Stomach/Bowel: No stranding adjacent to the stomach. No small bowel obstruction. Mild generalize stranding in the mesentery of the small bowel, of uncertain significance in the setting of small bilateral effusions and body wall edema. This may be developing generalized anasarca. 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 Chronicity uncertain, potentially acute/recent though with reconstitution of flow seen in superficial and deep femoral arteries distal to the site of occlusion in the upper thigh. Also however with occluded superficial femoral artery on the LEFT (image 117/3) No signs of adenopathy in the abdomen or the pelvis. Remaining  branches of the abdominal aorta grossly patent this venous phase assessment. Reproductive: Prostate mild to moderately enlarged. Urinary bladder without focal thickening or gross perivesical stranding. Other: Body wall edema.  No ascites.  No pneumoperitoneum. Musculoskeletal: No acute bone finding. No destructive bone process. Spinal degenerative changes. IMPRESSION: 1. RIGHT lower lobe pulmonary artery emboli in basilar segmental level branches. 2. 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 Given focal appearance of some of these areas would suggest 3 to six-month follow-up to ensure no underlying lesion is present. 3. 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 Findings are suspicious for recent and or acute occlusion superimposed on chronic vascular disease. 4. 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 Vascular surgery consultation is suggested. 5. 7 mm right solid pulmonary nodule within the upper lobe. Per Fleischner Society Guidelines, recommend a non-contrast Chest CT at 6-12 months. If patient is high risk for malignancy, recommend an additional non-contrast Chest CT at 18-24 months; if patient is low risk for malignancy a non-contrast Chest CT at 18-24 months is optional. These guidelines do not apply to immunocompromised patients and patients with cancer. Follow up in patients with significant comorbidities as clinically warranted. For lung cancer screening, adhere to Lung-RADS guidelines. Reference: Radiology. 2017; 284(1):228-43. Critical Value/emergent results were called by telephone at  the time of interpretation on 11-Jul-2022 at 3:01 pm to provider [facility] , who verbally acknowledged these results. Electronically Signed   By: Donzetta Kohut M.D.   On: 2022-07-11 15:01   CT Head Wo Contrast  Result Date: 2022-07-11 CLINICAL DATA:  Altered mental status, neck injury. EXAM: CT HEAD WITHOUT CONTRAST CT CERVICAL SPINE WITHOUT CONTRAST TECHNIQUE: Multidetector CT imaging of the head and cervical spine was performed following the standard protocol without intravenous contrast. Multiplanar CT image reconstructions of the cervical spine were also generated. Unfortunately, exam is limited due to patient motion artifact. RADIATION DOSE REDUCTION: This exam was performed according to the departmental dose-optimization program which includes automated exposure control, adjustment of the mA and/or kV according to patient size and/or use of iterative reconstruction technique. COMPARISON:  April 05, 2021. FINDINGS: CT HEAD FINDINGS Brain: No evidence of acute infarction, hemorrhage, hydrocephalus, extra-axial collection or mass lesion/mass effect. Vascular: No  hyperdense vessel or unexpected calcification. Skull: Normal. Negative for fracture or focal lesion. Sinuses/Orbits: No acute finding. Other: None. CT CERVICAL SPINE FINDINGS Alignment: Normal. Skull base and vertebrae: Only the first 6 cervical vertebra are well visualized due to patient motion artifact. No fracture or bony abnormality is involving these. Pathology involving C7 cannot be excluded on the basis of this exam. Soft tissues and spinal canal: No prevertebral fluid or swelling seen in visualized areas. No visible canal hematoma. Disc levels:  Moderate degenerative disc disease is noted at C3-4. Upper chest: Not visualized due to patient motion artifact. Other: None. IMPRESSION: No definite acute intracranial abnormality seen. Due to patient motion artifact, only the first 6 of cervical vertebra are well visualized. No fracture or  spondylolisthesis is seen involving these visualized vertebra. Fracture or other pathology involving C7 vertebra cannot be excluded on the basis of this exam. Electronically Signed   By: Marijo Conception M.D.   On: 06/18/2022 13:21   CT Cervical Spine Wo Contrast  Result Date: 06/04/2022 CLINICAL DATA:  Altered mental status, neck injury. EXAM: CT HEAD WITHOUT CONTRAST CT CERVICAL SPINE WITHOUT CONTRAST TECHNIQUE: Multidetector CT imaging of the head and cervical spine was performed following the standard protocol without intravenous contrast. Multiplanar CT image reconstructions of the cervical spine were also generated. Unfortunately, exam is limited due to patient motion artifact. RADIATION DOSE REDUCTION: This exam was performed according to the departmental dose-optimization program which includes automated exposure control, adjustment of the mA and/or kV according to patient size and/or use of iterative reconstruction technique. COMPARISON:  April 05, 2021. FINDINGS: CT HEAD FINDINGS Brain: No evidence of acute infarction, hemorrhage, hydrocephalus, extra-axial collection or mass lesion/mass effect. Vascular: No hyperdense vessel or unexpected calcification. Skull: Normal. Negative for fracture or focal lesion. Sinuses/Orbits: No acute finding. Other: None. CT CERVICAL SPINE FINDINGS Alignment: Normal. Skull base and vertebrae: Only the first 6 cervical vertebra are well visualized due to patient motion artifact. No fracture or bony abnormality is involving these. Pathology involving C7 cannot be excluded on the basis of this exam. Soft tissues and spinal canal: No prevertebral fluid or swelling seen in visualized areas. No visible canal hematoma. Disc levels:  Moderate degenerative disc disease is noted at C3-4. Upper chest: Not visualized due to patient motion artifact. Other: None. IMPRESSION: No definite acute intracranial abnormality seen. Due to patient motion artifact, only the first 6 of cervical  vertebra are well visualized. No fracture or spondylolisthesis is seen involving these visualized vertebra. Fracture or other pathology involving C7 vertebra cannot be excluded on the basis of this exam. Electronically Signed   By: Marijo Conception M.D.   On: 05/31/2022 13:21   DG Chest Port 1 View  Result Date: 05/30/2022 : Questionable sepsis - evaluate for abnormality EXAM: PORTABLE CHEST 1 VIEW COMPARISON:  Chest x-ray 04/05/2021. FINDINGS: Mild linear right basilar opacity. No confluent consolidation. No visible pleural effusions pneumothorax pre cardiomediastinal silhouette is within normal limits when accounting for technique soft were taken. IMPRESSION: Mild linear right basilar opacity, favor subsegmental atelectasis. No confluent consolidation. Electronically Signed   By: Margaretha Sheffield M.D.   On: 06/18/2022 11:41     PHYSICAL EXAM  Temp:  [97.9 F (36.6 C)-101.2 F (38.4 C)] 99.6 F (37.6 C) (09/18 0800) Pulse Rate:  [106-116] 110 (09/18 0600) Resp:  [11-33] 20 (09/18 0600) BP: (90-112)/(59-81) 103/77 (09/18 0600) SpO2:  [85 %-100 %] 100 % (09/18 0600) Weight:  [57.8 kg] 57.8 kg (09/18 0445)  General - Well nourished, well developed, in no apparent distress.  Ophthalmologic - fundi not visualized due to noncooperation.  Cardiovascular - Regular rhythm and rate.  Neuro - awake, alert, eyes open, not orientated to age, place, or time. No aphasia but severe dysarthia and paucity of speech, following most simple commands. Able to name 2/2 and repeat simple sentence in dysarthric voice. No gaze palsy but bilateral gaze incomplete, not tracking, not blinking to visual threat bilaterally. No significant facial droop. Tongue midline. Bilateral UEs 3+/5, no drift. LLE gangrene below the knee, cold, slight muscle contraction on request not against gravity. RLE cold below the knee, about 3-/5 slightly against gravity. Sensation not cooperative, b/l FTN b/l dysmetria left more than right,  gait not tested.     ASSESSMENT/PLAN Mr. SEVERN KROTZ is a 66 y.o. male with unclear medical history admitted for unresponsiveness, found to have hyponatremia, hypothermia, rhabdomyolysis, AKI. Now with LV thrombosis, ischemic lower extremity, DVT, and PE. Unable to obtain consent for BKA, discussed with vascular surgery and plan to hold for now.   Stroke:  bilateral PCA infarcts, small left MCA and right MCA/ACA infarcts, embolic pattern likely due to LV thrombus, cardiomyopathy with low EF, PE if PFO present, or endocarditis. CT head no acute abnormality MRI brain acute infarct in the occipital lobes bilaterally, this extends into the left parietal lobe and splenium, small acute infarct right posterior frontal white matter. MRA hold off at this time Carotid Doppler 1-39% stenosis bilaterally 2D Echo EF 20 to 25%, no LV thrombus LE venous Doppler Findings consistent with acute deep vein thrombosis involving the left popliteal vein, left posterior tibial veins, and left peroneal veins.  LDL 74 HgbA1c 12.6 UDS negative Heparin IV for VTE prophylaxis No antithrombotic prior to admission, now on heparin IV.  Ongoing aggressive stroke risk factor management Therapy recommendations: Pending Disposition: Pending, palliative care on board trying to find a family  PE and DVT CT chest showed right lower lobe PE On heparin IV TCD bubble study - no HITS at rest, unable to perform valsalva  LE venous Doppler DVT in left leg  LV thrombus Cardiomyopathy CT chest showed LV thrombus 2D echo EF 20 to 25%, no LV thrombus, no PFO Cardiology on board On heparin IV  Ischemic limbs CT abdomen pelvis right common iliac artery and left common femoral artery occlusion, splenic/renal infarcts Left lower extremity dry gangrene Right lower extremity coldness with limited movement and pain Vascular surgery on board Needed for bilateral AKA, however, poor prognosis On heparin IV  Sepsis/septic  shock ?  Endocarditis ?  Osteomyelitis Blood culture 9/13 Staph epidermidis Blood culture 915 NGTD UA negative Hypotension BP 90s->100s/110s On Ancef Hold off TEE to rule out endocarditis for now  Diabetes HgbA1c 12.6 goal < 7.0 Uncontrolled Currently on insulin CBG monitoring SSI DM education and close PCP follow up  Hyperlipidemia Home meds: None LDL 74, goal < 70 Transaminitis with elevated AST/ALT 77/87->118/28 No statin now given transaminitis          Dysphagia On tube feeding @ 60 Free water 200 every 6 hours Speech on board  Other Stroke Risk Factors Advanced age  Other Active Problems Hypernatremia, resolved.  Na 155->152->149->143->139->140 Rhabdomyolysis, CK downtrending  Hospital day # 5  Patient seen and examined by NP/APP with MD. MD to update note as needed.   Janine Ores, DNP, FNP-BC [facility] Pager: [phone]  ATTENDING NOTE: I reviewed above note and agree with the assessment and plan.  Pt was seen and examined.   No family at bedside.  Patient neurologically largely unchanged, however more drowsy sleepy than yesterday.  Palliative care on board, still have difficulty to locate family.  Vascular surgery and cardiology all leaning towards palliative care given poor prognosis.  We will hold off MRA or TEE given per prognosis.  Agree with palliative care/comfort care. Neurology will sign off. Please call with questions. Thanks for the consult.   For detailed assessment and plan, please refer to above/below as I have made changes wherever appropriate.   Rosalin Hawking, MD PhD Stroke Neurology 06/16/2022 6:52 PM    After hours, contact General Neurology

## 2022-06-16 NOTE — Progress Notes (Signed)
Daily Progress Note   Patient Name: Alexander Greene       Date: 06/16/2022 DOB: [birth year]  Age: 66 y.o. MRN#: [id number] Attending Physician: Alexander Kindle, MD Primary Care Physician: Patient, No Pcp Per Admit Date: 06/28/2022  Reason for Consultation/Follow-up: Establishing goals of care  Subjective: AM: Medical records reviewed including progress notes, labs, and imaging. Patient assessed at the bedside and remains lethargic/confused today.   Patient states "no matter where I am, there is pain somewhere" in response to this PA inquiring about his pain level and location. Emotional support provided. He does not respond when asked if he has any questions about his care and nods when I inform him that I plan to reach out to his former roommate to discuss goals of care and potential comfort care.   I then attempted to call roommate Alexander Greene but was unable to reach and also unable to leave a voicemail as it was full.  PM: Made another attempt to reach roommate Alexander Greene for goals of care conversation but was unable to reach or leave a voicemail message.  Questions and concerns addressed. PMT will continue to support holistically.   Length of Stay: 5  Physical Exam Vitals and nursing note reviewed.  Constitutional:      General: He is not in acute distress.    Appearance: He is cachectic. He is ill-appearing.  Pulmonary:     Effort: No respiratory distress.  Skin:    General: Skin is warm and dry.  Neurological:     Mental Status: He is lethargic.     Motor: Weakness present.  Psychiatric:        Behavior: Behavior is cooperative.           Vital Signs: BP 103/77   Pulse (!) 110   Temp 99.6 F (37.6 C) (Axillary)   Resp 20   Ht 5\' 11"  (1.803 m)   Wt 57.8 kg   SpO2 100%   BMI  17.77 kg/m  SpO2: SpO2: 100 % O2 Device: O2 Device: Room Air O2 Flow Rate:    Palliative Assessment/Data: PPS 30% with tube feeds    Palliative Care Assessment & Plan   Patient Profile: 66 y.o. male  with unknown past medical history presented to ED on 06/08/2022 after being found down on ground for unknown period of  time by roommate. Last was seen two days prior. Patient was admitted on 06/06/2022 with acute metabolic encephalopathy in setting of hypernatremia and severe dehydration, acute rhabdomyolysis, hyperkalemia, demand cardiac ischemia, RLL PE and possible LV thrombus, left common femoral artery occlusion, right common iliac artery occlusion, malnutrition, MSSA bacteremia. Vascular surgery was consulted for evaluation of ischemic lower extremities - patient will require iliofemoral embolectomy to the right lower extremity as well as left AKA, but is at high risk for bilateral lower extremity limb loss; needs medical stabilization prior to operative interventions. Cardiology was consulted for the evaluation of decreased LVEF of 20-25% in setting of elevated troponin - given severely decreased LV function, elevated troponin, and ECG abnormalities, patient could be considered for LHC with LVEDP evaluation once medically stable. However, in the setting of multiple acute medical issues of significant severity, urgent catheterization is not indicated and unlikely to modify patient's long-term prognosis.  Assessment: Principal Problem:   Metabolic encephalopathy Active Problems:   Hypernatremia   MSSA bacteremia   Acute pulmonary embolism (HCC)   LV (left ventricular) mural thrombus   Splenic infarct   Protein-calorie malnutrition, severe   Altered mental status   Femoral artery occlusion (HCC)   Acute combined systolic and diastolic heart failure (HCC)   Cerebrovascular accident (CVA) due to embolism of cerebral artery ([place])   Concern about end of life  Recommendations/Plan: Continue  full code/full scope treatment while attempting to reach roommate for ongoing goals of care conversation, unfortunately I have not been able to reach him thus far today Patient remains appropriate for transition to comfort care if/when his roommate is ready to proceed after efforts at locating relatives PMT will continue to support    Prognosis:  Poor  Discharge Planning: To Be Determined  Total time: I spent 35 minutes in the care of the patient today in the above activities and documenting the encounter.   Alexander Cooler, PA-C Palliative Medicine Team Team phone # [phone]  Thank you for allowing the Palliative Medicine Team to assist in the care of this patient. Please utilize secure chat with additional questions, if there is no response within 30 minutes please call the above phone number.  Palliative Medicine Team providers are available by phone from 7am to 7pm daily and can be reached through the team cell phone.  Should this patient require assistance outside of these hours, please call the patient's attending physician.

## 2022-06-16 NOTE — Progress Notes (Signed)
NAME:  Alexander Greene, MRN:  [id number], DOB:  09/05/1956, LOS: 5 ADMISSION DATE:  06/08/2022 CONSULTATION DATE:  06/12/2022 REFERRING MD:  Walker Shadow - EDP CHIEF COMPLAINT:  AMS, severe dehydration   History of Present Illness:  66 year old man who presented to [facility] ED 9/13 after being found down at his home by a neighbor. Last seen two days PTA. No known past medical history.  On ED arrival, patient was mildly hypothermic with HR 91, BP 131/81, RR 21, SpO2 99% on RA. Appeared severely dehydrated and cachectic. Labs demonstrated CBC WNL, Na 164, Cl 126, glucose 327, renal function WNL. Mildly elevated AST/ALT and Tbili. Trop 596 > 690. VBG 7.464/44/101/32.2; UA glucose > 500, moderate Hgb, +ketones/protein. CK 2897. LA 2.3. Ethanol/UDS negative. COVID/Flu negative. CXR with mild linear R basilar opacity, likely atelectasis. CT Head/Cspine with NAICA, but motion-degraded exam. CT Chest/A/P demonstrated RLL PE, c/f small LV thombus associated with papillary muscle, small splenic/renal infarcts, R common iliac artery occlusion, L common femoral artery occlusion. VVS was consulted for evaluation. Heparin started for multiple thromboembolic issues. Patient was given Ativan for CT scans and became somnolent requiring flumazenil administration. D5 was initiated for hypernatremia (transitioned to LR). Blood cultures pending, broad spectrum vanc/cefepime initiated.   PCCM consulted for admission.  Pertinent Medical History:  No past medical history on file.  Significant Hospital Events: Including procedures, antibiotic start and stop dates in addition to other pertinent events   9/13 - Found down at home by neighbor, last seen 2 days PTA. To [facility] ED for evaluation. HyperNa, hyperglycemic, elevated CK, elevated AST/ALT; UDS negative. Multiple thomboembolic issues noted on imaging (as above). VVS consulted for ischemic LLE. 9/14 VANC/CEFE> Ancef, BC>MSSA 9/16 seen by vascular surgery noted to have grossly  necrotic left lower extremity felt to require above-the-knee amputation.  Also had cold right lower extremity which would require revascularization and 4 compartment fasciotomies. 9/17 still no progress w/ GOC. Palliative working on this.  Surgery on hold given concern about quality of life   Interim History / Subjective:   Remains encephalopathic he is purposeful Objective:  Blood pressure 103/77, pulse (Abnormal) 110, temperature 97.9 F (36.6 C), temperature source Axillary, resp. rate 20, height 5\' 11"  (1.803 m), weight 57.8 kg, SpO2 100 %.        Intake/Output Summary (Last 24 hours) at 06/16/2022 0754 Last data filed at 06/16/2022 0600 Gross per 24 hour  Intake 2603.65 ml  Output 1140 ml  Net 1463.65 ml   Filed Weights   06/14/22 0500 06/12/2022 0500 06/16/22 0445  Weight: 55.1 kg 57.8 kg 57.8 kg   Physical Examination: General 66 year old chronically ill and malnourished cachectic male he remains encephalopathic in the intensive care HEENT mucous membranes are dry temporal wasting noted feeding tube in nare pupils equal reactive Cardiac: Regular rate and rhythm Pulmonary clear to auscultation Abdomen soft Extremities: Left extremity necrotic appearing, cold, dressing intact.  Right lower extremity cool with no palpable pulses Neuro: Awake, agitated at times, moves all extremities, encephalopathic.  Resolved Hospital Problem List:   Acute rhabdomyolysis  Assessment & Plan:    Catastrophic poly-embolic state (lung, bilateral femoral, spleen, ?brain, heart, kidneys) in setting of MSSA bacteremia; question if clot/septic or both with associated acute metabolic encephalopathy Plan Continuing serial neurochecks IV heparin Eventual MRA Additional recommendations per neurology Continue Ancef  Stress vs. Ischemic cardiomyopathy (EF 20 to 25%) Plan Supportive care Telemetry Holding goal-directed medical therapy at this point  Nonviable LLE, possible nonviable RLE,  sensation  still intact in the right leg Plan Working on goals of care, he will either need surgery or palliation  Profound protein calorie malnutrition, complicated by probable refeeding syndrome and severe muscular deconditioning Plan Continue tube feeds Additional recommendations per dietary  7 mm solid nodule in the right upper lobe Plan Follow-up as outpatient if he survives    Goals of care: Continue to await further discussion with patient's prior roommate.  His niece did not want to make decisions on his behalf.  Roommate wanted to reach out to the patient's pastor.  From a medical standpoint he will need surgery to survive, however I am not sure he can survive surgery.  I also worry that his overall prognosis is quite grim given his poor nutritional status, and the some of his embolic injuries.  It seems that in the best case scenario he will require 24-7 nursing care, and I worry about the quality of his life postoperatively  Best Practice: (right click and "Reselect all SmartList Selections" daily)   Diet/type: TF DVT prophylaxis: systemic heparin GI prophylaxis: PPI Lines: N/A Foley:  N/A Code Status:  full code Last date of multidisciplinary goals of care discussion [see above]  My critical care time is 32 minutes Erick Colace ACNP-BC [place] Pager # [phone] OR # [phone] if no answer

## 2022-06-16 NOTE — Progress Notes (Signed)
   Chart reviewed - noted prognosis is poor. High risk for surgery given cardiomyopathy with LVEF 20%- would likely need bilateral AKA per vascular surgery. Unfortunately, palliative/comfort care may be the only option. Unlikely he will be cured with surgery or medical therapy. Agree with proceeding to comfort measures.  Cards will be available as needed.  [place] will sign off.   Medication Recommendations:  none Other recommendations (labs, testing, etc):  none Follow up as an outpatient:  PRN  Pixie Casino, MD, [facility], [place] Director of the [facility] Lipid [facility] &  Cardiovascular [facility] Reduction [facility] Diplomate of the American Board of Clinical Lipidology Attending Cardiologist  Direct Dial: [phone]  Fax: [phone]  Website:  [email or web address]

## 2022-06-17 DIAGNOSIS — E1165 Type 2 diabetes mellitus with hyperglycemia: Secondary | ICD-10-CM

## 2022-06-17 DIAGNOSIS — I96 Gangrene, not elsewhere classified: Secondary | ICD-10-CM | POA: Insufficient documentation

## 2022-06-17 DIAGNOSIS — I248 Other forms of acute ischemic heart disease: Secondary | ICD-10-CM

## 2022-06-17 DIAGNOSIS — I82409 Acute embolism and thrombosis of unspecified deep veins of unspecified lower extremity: Secondary | ICD-10-CM

## 2022-06-17 DIAGNOSIS — E119 Type 2 diabetes mellitus without complications: Secondary | ICD-10-CM

## 2022-06-17 DIAGNOSIS — L8990 Pressure ulcer of unspecified site, unspecified stage: Secondary | ICD-10-CM | POA: Insufficient documentation

## 2022-06-17 LAB — VITAMIN B1: Vitamin B1 (Thiamine): 58.1 nmol/L — ABNORMAL LOW (ref 66.5–200.0)

## 2022-06-17 LAB — CBC
HCT: 30.9 % — ABNORMAL LOW (ref 39.0–52.0)
Hemoglobin: 10 g/dL — ABNORMAL LOW (ref 13.0–17.0)
MCH: 29.9 pg (ref 26.0–34.0)
MCHC: 32.4 g/dL (ref 30.0–36.0)
MCV: 92.5 fL (ref 80.0–100.0)
Platelets: 295 10*3/uL (ref 150–400)
RBC: 3.34 MIL/uL — ABNORMAL LOW (ref 4.22–5.81)
RDW: 13.5 % (ref 11.5–15.5)
WBC: 10.2 10*3/uL (ref 4.0–10.5)
nRBC: 0 % (ref 0.0–0.2)

## 2022-06-17 LAB — BASIC METABOLIC PANEL
Anion gap: 12 (ref 5–15)
BUN: 19 mg/dL (ref 8–23)
CO2: 24 mmol/L (ref 22–32)
Calcium: 7.8 mg/dL — ABNORMAL LOW (ref 8.9–10.3)
Chloride: 105 mmol/L (ref 98–111)
Creatinine, Ser: 0.89 mg/dL (ref 0.61–1.24)
GFR, Estimated: >60 mL/min (ref >60)
Glucose, Bld: 61 mg/dL — ABNORMAL LOW (ref 70–99)
Potassium: 4 mmol/L (ref 3.5–5.1)
Sodium: 141 mmol/L (ref 135–145)

## 2022-06-17 LAB — GLUCOSE, CAPILLARY
Glucose-Capillary: 139 mg/dL — ABNORMAL HIGH (ref 70–99)
Glucose-Capillary: 166 mg/dL — ABNORMAL HIGH (ref 70–99)
Glucose-Capillary: 186 mg/dL — ABNORMAL HIGH (ref 70–99)
Glucose-Capillary: 63 mg/dL — ABNORMAL LOW (ref 70–99)
Glucose-Capillary: 87 mg/dL (ref 70–99)

## 2022-06-17 LAB — HEPARIN LEVEL (UNFRACTIONATED): Heparin Unfractionated: 0.34 IU/mL (ref 0.30–0.70)

## 2022-06-17 MED ORDER — HALOPERIDOL 0.5 MG PO TABS: 0.5000 mg | ORAL_TABLET | ORAL | Status: DC | PRN

## 2022-06-17 MED ORDER — LORAZEPAM 2 MG/ML PO CONC: 1.0000 mg | ORAL | Status: DC | PRN

## 2022-06-17 MED ORDER — DIPHENHYDRAMINE HCL 50 MG/ML IJ SOLN: 12.5000 mg | INTRAMUSCULAR | Status: DC | PRN

## 2022-06-17 MED ORDER — DOCUSATE SODIUM 50 MG/5ML PO LIQD: 100.0000 mg | Freq: Two times a day (BID) | ORAL | Status: DC | PRN

## 2022-06-17 MED ORDER — MORPHINE SULFATE (CONCENTRATE) 10 MG/0.5ML PO SOLN: 5.0000 mg | ORAL | Status: DC | PRN

## 2022-06-17 MED ORDER — LORAZEPAM 1 MG PO TABS: 1.0000 mg | ORAL_TABLET | ORAL | Status: DC | PRN

## 2022-06-17 MED ORDER — GLYCOPYRROLATE 0.2 MG/ML IJ SOLN
0.2000 mg | INTRAMUSCULAR | Status: DC | PRN
  Filled 2022-06-17: qty 1

## 2022-06-17 MED ORDER — POLYVINYL ALCOHOL 1.4 % OP SOLN: 1.0000 [drp] | Freq: Four times a day (QID) | OPHTHALMIC | Status: DC | PRN

## 2022-06-17 MED ORDER — SODIUM CHLORIDE 0.9% FLUSH
3.0000 mL | Freq: Two times a day (BID) | INTRAVENOUS | Status: DC
  Administered 2022-06-17 – 2022-06-18 (×3): 3 mL via INTRAVENOUS

## 2022-06-17 MED ORDER — ONDANSETRON 4 MG PO TBDP: 4.0000 mg | ORAL_TABLET | Freq: Four times a day (QID) | ORAL | Status: DC | PRN

## 2022-06-17 MED ORDER — HALOPERIDOL LACTATE 5 MG/ML IJ SOLN
0.5000 mg | INTRAMUSCULAR | Status: DC | PRN
  Administered 2022-06-18: 0.5 mg via INTRAVENOUS
  Filled 2022-06-17: qty 1

## 2022-06-17 MED ORDER — MORPHINE SULFATE (PF) 2 MG/ML IV SOLN
1.0000 mg | INTRAVENOUS | Status: DC | PRN
  Administered 2022-06-17 – 2022-06-18 (×4): 1 mg via INTRAVENOUS
  Filled 2022-06-17 (×4): qty 1

## 2022-06-17 MED ORDER — GLYCOPYRROLATE 0.2 MG/ML IJ SOLN
0.2000 mg | INTRAMUSCULAR | Status: DC | PRN
  Administered 2022-06-17 – 2022-06-18 (×2): 0.2 mg via INTRAVENOUS
  Filled 2022-06-17: qty 1

## 2022-06-17 MED ORDER — GLYCOPYRROLATE 1 MG PO TABS: 1.0000 mg | ORAL_TABLET | ORAL | Status: DC | PRN

## 2022-06-17 MED ORDER — ACETAMINOPHEN 650 MG RE SUPP: 650.0000 mg | Freq: Four times a day (QID) | RECTAL | Status: DC | PRN

## 2022-06-17 MED ORDER — ONDANSETRON HCL 4 MG/2ML IJ SOLN: 4.0000 mg | Freq: Four times a day (QID) | INTRAMUSCULAR | Status: DC | PRN

## 2022-06-17 MED ORDER — HALOPERIDOL LACTATE 2 MG/ML PO CONC: 0.5000 mg | ORAL | Status: DC | PRN

## 2022-06-17 MED ORDER — SODIUM CHLORIDE 0.9 % IV SOLN: 250.0000 mL | INTRAVENOUS | Status: DC | PRN

## 2022-06-17 MED ORDER — ACETAMINOPHEN 325 MG PO TABS: 650.0000 mg | ORAL_TABLET | Freq: Four times a day (QID) | ORAL | Status: DC | PRN

## 2022-06-17 MED ORDER — POLYETHYLENE GLYCOL 3350 17 G PO PACK: 17.0000 g | PACK | Freq: Every day | ORAL | Status: DC | PRN

## 2022-06-17 MED ORDER — LORAZEPAM 2 MG/ML IJ SOLN
1.0000 mg | INTRAMUSCULAR | Status: DC | PRN
  Administered 2022-06-17 – 2022-06-18 (×2): 1 mg via INTRAVENOUS
  Filled 2022-06-17 (×2): qty 1

## 2022-06-17 MED ORDER — SODIUM CHLORIDE 0.9% FLUSH: 3.0000 mL | INTRAVENOUS | Status: DC | PRN

## 2022-06-17 NOTE — Progress Notes (Signed)
Progress Note   Patient: Alexander Greene QAS:[id number] DOB: [birth year] DOA: 07-02-2022     6 DOS: the patient was seen and examined on 06/17/2022   Brief hospital course: 66 year old male PMH unknown presented 9/13 after being found down on ground for unknown period of time by roommate. Last was seen two days prior. Patient was admitted on 07-02-22 with acute metabolic encephalopathy in setting of hypernatremia and severe dehydration, acute rhabdomyolysis, hyperkalemia, demand cardiac ischemia, RLL PE and possible LV thrombus, left common femoral artery occlusion, right common iliac artery occlusion, malnutrition, MSSA bacteremia.  Admitted by critical care, seen in consultation by cardiology for demand ischemia, severe systolic CHF of unknown duration with recommendation for conservative medical management given overall condition and inability to tolerate goal-directed medical therapy.  Not an interventional candidate.  Seen by palliative care.  Goals of care complicated, patient with no family willing to participate in decision-making.  There is a roommate who may be willing to.  Seen by neurology for embolic infarcts with conservative management recommended.  Seen by vascular surgery for bilateral lower extremity ischemia, left leg gangrene requiring AKA, right leg requiring revascularization, fasciotomy and suspected eventual AKA.  Goals of care been difficult and surgery has been delayed in attempt to finalize goals of care.  Patient is critically ill, survival in doubt, needs either operative intervention or conservative/palliative management.  Goals of care are exceedingly difficult as documented.  I think in the absence of the decision maker that two-physician DNR is appropriate and in the patient's best interest.  Will discuss with medical team.  Assessment and Plan: Catastrophic poly-embolic state (lung, bilateral femoral, spleen, brain, kidneys) in setting of MSSA bacteremia; question if  clot/septic or both with associated acute metabolic encephalopathy -- Remains encephalopathic.  Afebrile and no leukocytosis.  Hemodynamic stable.  Continue and biotic.  Stroke: bilateral PCA infarcts, small left MCA and right MCA/ACA infarcts, embolic pattern  --MRA and TEE deferred; conservative management per neurology.  Continues on heparin  BLE ischemia, nonviable LLE, possible nonviable RLE, sensation still intact in the right leg -- Vascular surgery notes surgery would involve left BKA, right iliofemoral embolectomy and likely AKA. --Discussed with vascular surgery earlier today, need to make a decision whether to proceed with surgery or conservative management.   Severe cardiomyopathy (EF 20 to 25%) --etiology unclear, stress vs ischemic, not a candidate for intervention.  Suspect ischemic. --Holding goal-directed medical therapy at this point, not a candidate for intervention per cardiology.  Demand cardiac ischemia -- Secondary to acute illness no intervention per cardiology  PE and DVT --CT chest showed right lower lobe PE --LE venous Doppler DVT in left leg E venous Doppler Findings consistent with acute deep vein thrombosis involving the left popliteal vein, left posterior tibial veins, and left peroneal veins.  --On heparin IV --TCD bubble study - no HITS at rest, unable to perform   Profound/severe protein calorie malnutrition, underweight, complicated by probable refeeding syndrome and severe muscular deconditioning --TF in process.  Monitor electrolytes.  Diabetes type 2 with hyperglycemia -- 1 episode of hypoglycemia this morning.  Hyperglycemic yesterday.  Acute rhabdomyolysis    7 mm solid nodule in the right upper lobe -- Can follow-up as an outpatient if patient survives.   LV thrombus ruled out     Subjective:  Appears critically ill, uncomfortable at times and then resting.  Unable to provide history.  Physical Exam: Vitals:   06/16/22 2305 06/17/22  0310 06/17/22 0643 06/17/22 0753  BP:  101/73 97/76 99/69  91/72  Pulse: (!) 118 87  (!) 109  Resp: 18 16  12   Temp: 98.5 F (36.9 C) (!) 97.5 F (36.4 C)  99.3 F (37.4 C)  TempSrc: Oral Oral  Axillary  SpO2:  91%  91%  Weight:      Height:       Physical Exam Vitals reviewed.  Constitutional:      General: He is in acute distress.     Appearance: He is ill-appearing and toxic-appearing.  Cardiovascular:     Rate and Rhythm: Normal rate and regular rhythm.     Heart sounds: No murmur heard. Pulmonary:     Effort: Pulmonary effort is normal. No respiratory distress.     Breath sounds: No wheezing, rhonchi or rales.  Abdominal:     General: There is no distension.     Palpations: Abdomen is soft.  Musculoskeletal:        General: No swelling.     Right lower leg: No edema.     Left lower leg: No edema.  Skin:    Comments: Left foot is black.  Left lower leg is otherwise bandaged.  Right lower extremity is cool.  Neurological:     Mental Status: He is alert.     Comments: Confused, somnolent, does not participate in examination.  Psychiatric:     Comments: Cannot assess.     Data Reviewed:  Hypoglycemic this a.m. BMP unremarkable.  Hemoglobin stable at 10  Family Communication: see PMT notes, niece not willing to participate in decision-making.  Attempting to secure a roommate as Ambulance person.  Disposition: Status is: Inpatient Remains inpatient appropriate because: Critically ill  Planned Discharge Destination:  Unknown    Time spent: 70 minutes, chart review, examination of patient, coordination of care, critical care   Author: Murray Hodgkins, MD 06/17/2022 11:34 AM  For on call review [email or web address].

## 2022-06-17 NOTE — Hospital Course (Addendum)
66 year old male PMH unknown presented 9/13 after being found down on ground for unknown period of time by roommate. Last was seen two days prior. Patient was admitted on 06/23/2022 with acute metabolic encephalopathy in setting of hypernatremia and severe dehydration, acute rhabdomyolysis, hyperkalemia, demand cardiac ischemia, RLL PE and possible LV thrombus, left common femoral artery occlusion, right common iliac artery occlusion, malnutrition, MSSA bacteremia.  Admitted by critical care, seen in consultation by cardiology for demand ischemia, severe systolic CHF of unknown duration with recommendation for conservative medical management given overall condition and inability to tolerate goal-directed medical therapy.  Not an interventional candidate.  Seen by palliative care.  Goals of care complicated, patient with no family willing to participate in decision-making.  There is a roommate who may be willing to.  Seen by neurology for embolic infarcts with conservative management recommended.  Seen by vascular surgery for bilateral lower extremity ischemia, left leg gangrene requiring AKA, right leg requiring revascularization, fasciotomy and suspected eventual AKA.  Goals of care been difficult and surgery has been delayed in attempt to finalize goals of care.  Patient with very poor prognosis.  Patient not able to make decisions due to encephalopathy. Case discussed by the medical team and consultants. Patient with nobody willing to make decision for him. Considering patient condition, decision was made to transitioned to comfort care to prevent any further suffering.

## 2022-06-17 NOTE — Plan of Care (Signed)
Pt was admitted for unresponsiveness, found to have hyponatremia, hypothermia, rhabdomyolysis, AKI. Now with LV thrombus, severe cardiomyopathy, bilateral ischemic lower extremities, sepsis, septic shock, DVT, PE, embolic strokes, uncontrolled DM, and dysphagia. Complex family/POA dynamic- unable to obtain consent for code status, surgery or comfort care measures.   From neuro standpoint, pt current embolic stroke is not life threatening, however, he did carry high risk of recurrent stroke, especially in the setting of LV thrombus, severe cardiomyopathy with low EF, sepsis, septic shock and hypotension. As my understanding of his whole condition, his ischemic limbs and septic shock likely to be more life-threatening at this time, however, he is not a good surgical candidate especially with EF about 20-25% and LV thrombus, anemia and hypotension. He may not able to tolerate the major surgery of b/l AKAs. His condition can certainly be further complicated by intubation, respiratory failure, significant blood loss, severe anemia, hypotension, and heart failure, which could in turn precipitate recurrent stroke too. Taken all together, I would consider his prognosis is very poor and it is my opinion to recommend palliative care and primary team to explore the options of comfort care measures at this time.   Rosalin Hawking, MD PhD Stroke Neurology 06/17/2022 2:02 PM

## 2022-06-17 NOTE — Inpatient Diabetes Management (Signed)
Inpatient Diabetes Program Recommendations  AACE/ADA: New Consensus Statement on Inpatient Glycemic Control (2015)  Target Ranges:  Prepandial:   less than 140 mg/dL      Peak postprandial:   less than 180 mg/dL (1-2 hours)      Critically ill patients:  140 - 180 mg/dL   Lab Results  Component Value Date   GLUCAP 87 06/17/2022   HGBA1C 12.6 (H) 06/12/2022    Review of Glycemic Control  Latest Reference Range & Units 06/16/22 11:07 06/16/22 15:41 06/16/22 20:02 06/16/22 23:59 06/17/22 03:13 06/17/22 07:45 06/17/22 08:55  Glucose-Capillary 70 - 99 mg/dL 233 (H) 232 (H) 192 (H) 166 (H) 139 (H) 63 (L) 87    Diabetes history: no previous history   Current orders for Inpatient glycemic control:  Semglee 10 units Daily Novolog 2-6 units Q4 hours  A1c 12.6% on 9/14 Potential goals of care discussions Vital AF 60 ml/hr  Inpatient Diabetes Program Recommendations:    Hypoglycemia 64 this am  -   Reduce Tube Feed coverage to Novolog 2 units Q4 hours.  Thanks,  Tama Headings RN, MSN, BC-ADM Inpatient Diabetes Coordinator Team Pager [phone] (8a-5p)
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
## 2022-06-17 NOTE — Progress Notes (Signed)
Daily Progress Note   Patient Name: Alexander Greene       Date: 06/17/2022 DOB: July 22, 1956  Age: 66 y.o. MRN#: [id number] Attending Physician: Alexander Greene Primary Care Physician: Patient, No Pcp Per Admit Date: 06/28/2022  Reason for Consultation/Follow-up: Establishing goals of care  Subjective: Medical records reviewed including progress notes, labs. Patient assessed at the bedside and remains encephalopathic, appears uncomfortable and moaning. Wearing mitten restraints.   I then call patient's roommate Alexander Greene for ongoing goals of care conversation. He tells me he was able to speak with patient's pastor but unfortunately did not locate any other relatives who could help make an informed decision based on patient's wishes. Alexander Greene does not feel like he is able to make decisions on code status, surgery, or comfort measures. He is agreeable to patient's physicians moving forward with what they think is best in their medical opinion.   I discussed this with Dr. Virl Greene and Dr. Sarajane Greene.  Questions and concerns addressed. PMT will continue to support holistically.   Length of Stay: 6  Physical Exam Vitals and nursing note reviewed.  Constitutional:      General: He is not in acute distress.    Appearance: He is cachectic. He is ill-appearing.  Pulmonary:     Effort: No respiratory distress.  Skin:    General: Skin is warm and dry.  Neurological:     Mental Status: He is lethargic.     Motor: Weakness present.  Psychiatric:        Behavior: Behavior is cooperative.           Vital Greene: BP 108/76 (BP Location: Right Arm)   Pulse (!) 106   Temp 98.4 F (36.9 C) (Axillary)   Resp 14   Ht 5\' 11"  (1.803 m)   Wt 57.8 kg   SpO2 100%   BMI 17.77 kg/m  SpO2: SpO2: 100  % O2 Device: O2 Device: Room Air O2 Flow Rate:    Palliative Assessment/Data: PPS 30% with tube feeds   Palliative Care Assessment & Plan   Patient Profile: 66 y.o. male  with unknown past medical history presented to ED on 06/25/2022 after being found down on ground for unknown period of time by roommate. Last was seen two days prior. Patient was admitted on 06/10/2022 with acute metabolic encephalopathy  in setting of hypernatremia and severe dehydration, acute rhabdomyolysis, hyperkalemia, demand cardiac ischemia, RLL PE and possible LV thrombus, left common femoral artery occlusion, right common iliac artery occlusion, malnutrition, MSSA bacteremia. Vascular surgery was consulted for evaluation of ischemic lower extremities - patient will require iliofemoral embolectomy to the right lower extremity as well as left AKA, but is at high risk for bilateral lower extremity limb loss; needs medical stabilization prior to operative interventions. Cardiology was consulted for the evaluation of decreased LVEF of 20-25% in setting of elevated troponin - given severely decreased LV function, elevated troponin, and ECG abnormalities, patient could be considered for LHC with LVEDP evaluation once medically stable. However, in the setting of multiple acute medical issues of significant severity, urgent catheterization is not indicated and unlikely to modify patient's long-term prognosis.  Assessment: Principal Problem:   Metabolic encephalopathy Active Problems:   Hypernatremia   MSSA bacteremia   Acute pulmonary embolism (HCC)   Splenic infarct   Protein-calorie malnutrition, severe   Femoral artery occlusion (HCC)   Acute combined systolic and diastolic heart failure (HCC)   Cerebrovascular accident (CVA) due to embolism of cerebral artery (HCC)   Pressure injury of skin   Gangrene (HCC)   DVT (deep venous thrombosis) ([place])   Demand ischemia ([place])   DM type 2 (diabetes mellitus, type 2) ([place])    Concern about end of life  Recommendations/Plan: Patient's roommate does NOT feel comfortable acting as patient's surrogate decision-maker Given that there is no individual with an established relationship with the patient who is willing to convey the wishes of the patient, I agree with enacting a two physician DNR and transitioning to comfort measures utilizing the [place] futility policy - ongoing aggressive interventions will not improve patient's QOL, will serve no medical benefit, and will likely cause harm for this patient with a very poor prognosis and no chance for recovery Recommend ethics consultation PMT will continue to support    Prognosis:  Poor  Discharge Planning: To Be Determined   MDM: high   Alexander Greene Alexander Greene Palliative Medicine Team Team phone # [phone]  Thank you for allowing the Palliative Medicine Team to assist in the care of this patient. Please utilize secure chat with additional questions, if there is no response within 30 minutes please call the above phone number.  Palliative Medicine Team providers are available by phone from 7am to 7pm daily and can be reached through the team cell phone.  Should this patient require assistance outside of these hours, please call the patient's attending physician.

## 2022-06-17 NOTE — IPAL (Signed)
  Interdisciplinary Goals of Care Family Meeting   Date carried out: 06/17/2022  Location of the meeting: Bedside  Member's involved: Physicians and NP  Durable Power of Attorney or acting medical decision maker: none and no family/friend willing to act as Media planner.  Patient is profoundly encephalopathic and unable to participate in decision-making.  Discussion:  Very tragic case.  Chart reviewed in great detail as documented in my earlier note.  I have discussed the case in detail with Dr. Virl Cagey from vascular surgery, as well as Ms. Cooper of palliative care, Dr. Erlinda Hong and Dr. Tacy Learn.  Also discussed with the ethics committee [place].  In my opinion, it is in the patient's best interest to be DNR/DNI, and with Dr. Virl Cagey concurrence, I will make him DNR on the order of 2 physicians.  Furthermore in discussion with Dr. Virl Cagey, in this patient with bilateral lower extremity ischemia, gangrene of the left leg, catastrophic multi-embolic state, acute strokes, bacteremia, severe cardiomyopathy, pulmonary embolism, DVT, severe protein calorie malnutrition and profound metabolic encephalopathy, it is my opinion that prognosis is grim and that further treatment surgically is not in the patient's best interest, nor is ongoing antibiotics, antithrombotics, insulin and other medications not directed at comfort.  In my medical opinion, even were the patient to survive surgery without complication, recovery is not anticipated.  The best course of action in my opinion is for the patient is to pursue full comfort care and hospice.    Code status: Full DNR  Disposition: In-patient comfort care  Time spent in discussions: 45 minutes  Murray Hodgkins, MD  06/17/2022, 2:15 PM

## 2022-06-17 NOTE — Progress Notes (Signed)
[facility] for heparin Indication: segmental PE + severe bilateral LE PAD in the setting of CVA   No Known Allergies  Patient Measurements: Height: 5\' 11"  (180.3 cm) Weight: 57.8 kg (127 lb 6.8 oz) IBW/kg (Calculated) : 75.3 Heparin Dosing Weight: 54.4kg  Vital Signs: Temp: 97.5 F (36.4 C) (09/19 0310) Temp Source: Oral (09/19 0310) BP: 99/69 (09/19 0643) Pulse Rate: 87 (09/19 0310)  Labs: Recent Labs    2022-07-05 0613 06/16/22 0609 06/17/22 0542  HGB 11.3* 10.4* 10.0*  HCT 33.6* 31.7* 30.9*  PLT 187 195 295  HEPARINUNFRC 0.35 0.31 0.34  CREATININE 0.63 0.76 0.89    Estimated Creatinine Clearance: 67.6 mL/min (by C-G formula based on SCr of 0.89 mg/dL).   Assessment: 66 yo M  found down at his home and found to have RLL segmental PE + severe BL LE PAD + CVA.  No anticoagulation prior to admission. Pharmacy consulted for heparin.    9/16 Discussed heparin goal with Neurology given CVA and extensive arterial clots. Neuro is ok with normal HL goal range given small strokes. Will aim for middle of goal range.   Heparin level 0.34 is therapeutic on 1100 units/hr. No administration issues or bleeding concerns reported. CBC stable.   Goal of Therapy:  Heparin Level 0.3- 0.7 units/ml (aim for ~0.5 if able) Monitor platelets by anticoagulation protocol: Yes   Plan:  Continue heparin infusion at 1100 units/hr  Monitor daily heparin level, CBC Monitor for signs/symptoms of bleeding   Dimple Nanas, PharmD, BCPS 06/17/2022 7:22 AM

## 2022-06-17 NOTE — Progress Notes (Addendum)
Patient seen and examined this morning The left leg continues to be malodorous, no signal in either lower limb. Left leg needs above-knee amputation, right leg would need revascularization prior to above-knee amputation for healing.  Alexander Greene's mental status has not improved. I do not think bilateral above-knee amputations would provide significant benefit in the setting of severe cardiomyopathy, sepsis, shock, DVT, PE, embolic strokes, uncontrolled DM, dysphagia.   It is unfortunate that he does not have friends or family willing to take the responsibility of medical decision making, however it is my professional opinion that comfort care measures be implemented including DNR/ DNI, with no escalation in care.   Alexander Greene

## 2022-06-18 LAB — CULTURE, BLOOD (ROUTINE X 2)
Culture: NO GROWTH
Culture: NO GROWTH
Special Requests: ADEQUATE
Special Requests: ADEQUATE
Special Requests: ADEQUATE

## 2022-06-18 MED ORDER — MORPHINE BOLUS VIA INFUSION: 1.0000 mg | INTRAVENOUS | Status: DC | PRN

## 2022-06-18 MED ORDER — MORPHINE 100MG IN NS 100ML (1MG/ML) PREMIX INFUSION
2.0000 mg/h | INTRAVENOUS | Status: DC
  Administered 2022-06-18: 2 mg/h via INTRAVENOUS
  Filled 2022-06-18: qty 100

## 2022-06-18 NOTE — Assessment & Plan Note (Addendum)
BLE ischemia, nonviable LLE, possible nonviable RLE, sensation still intact in the right leg -- Vascular surgery notes surgery would involve left BKA, right iliofemoral embolectomy and likely AKA. --Discussed with vascular surgery earlier today, need to make a decision whether to proceed with surgery or conservative management.  Acute rhabdomyolysis

## 2022-06-18 NOTE — Progress Notes (Signed)
Daily Progress Note   Patient Name: Alexander Greene       Date: 06/18/2022 DOB: December 02, 1955  Age: 66 y.o. MRN#: [id number] Attending Physician: Tawni Millers Primary Care Physician: Patient, No Pcp Per Admit Date: 2022-07-11  Reason for Consultation/Follow-up: Establishing goals of care  Subjective: Medical records reviewed. Patient assessed at the bedside.  He is tachypneic and appears uncomfortable with increased work of breathing.  Staring off and does not respond to this PA.  Discussed with RN.  No visitors present.  PMT will continue to support holistically.  Length of Stay: 7  Physical Exam Vitals and nursing note reviewed.  Constitutional:      General: He is not in acute distress.    Appearance: He is cachectic. He is ill-appearing.  Cardiovascular:     Rate and Rhythm: Normal rate.  Pulmonary:     Effort: Tachypnea and accessory muscle usage present.  Skin:    General: Skin is warm and dry.  Neurological:     Mental Status: He is lethargic.     Motor: Weakness present.           Vital Signs: BP 120/86 (BP Location: Right Arm)   Pulse 93   Temp 99.5 F (37.5 C) (Oral)   Resp (!) 22   Ht 5\' 11"  (1.803 m)   Wt 57.8 kg   SpO2 100%   BMI 17.77 kg/m  SpO2: SpO2: 100 % O2 Device: O2 Device: Room Air O2 Flow Rate:    Palliative Assessment/Data: PPS 10-20%   Palliative Care Assessment & Plan   Patient Profile: 66 y.o. male  with unknown past medical history presented to ED on 07-11-2022 after being found down on ground for unknown period of time by roommate. Last was seen two days prior. Patient was admitted on 2022-07-11 with acute metabolic encephalopathy in setting of hypernatremia and severe dehydration, acute rhabdomyolysis, hyperkalemia, demand cardiac  ischemia, RLL PE and possible LV thrombus, left common femoral artery occlusion, right common iliac artery occlusion, malnutrition, MSSA bacteremia. Vascular surgery was consulted for evaluation of ischemic lower extremities - patient will require iliofemoral embolectomy to the right lower extremity as well as left AKA, but is at high risk for bilateral lower extremity limb loss; needs medical stabilization prior to operative interventions. Cardiology was consulted for the evaluation of  decreased LVEF of 20-25% in setting of elevated troponin - given severely decreased LV function, elevated troponin, and ECG abnormalities, patient could be considered for LHC with LVEDP evaluation once medically stable. However, in the setting of multiple acute medical issues of significant severity, urgent catheterization is not indicated and unlikely to modify patient's long-term prognosis.  Assessment: Principal Problem:   Metabolic encephalopathy Active Problems:   Hypernatremia   MSSA bacteremia   Acute pulmonary embolism (HCC)   Splenic infarct   Protein-calorie malnutrition, severe   Femoral artery occlusion (HCC)   Acute combined systolic and diastolic heart failure (HCC)   Cerebrovascular accident (CVA) due to embolism of cerebral artery (HCC)   Pressure injury of skin   Gangrene ([place])   DVT (deep venous thrombosis) ([place])   Demand ischemia ([place])   DM type 2 (diabetes mellitus, type 2) ([place])   end of life care  Recommendations/Plan: Continue comfort care measures per [facility] Continue DNR Ordered morphine gtt with PRN boluses for pain/dyspnea, give opioids/benzos for air hunger rather than oxygen supplementation PMT will continue to follow and support  Prognosis:  Poor  Discharge Planning: Anticipated Hospital Death   MDM: high   Johnell Comings Palliative Medicine Team Team phone # [phone]  Thank you for allowing the Palliative Medicine Team to assist in the care of this  patient. Please utilize secure chat with additional questions, if there is no response within 30 minutes please call the above phone number.  Palliative Medicine Team providers are available by phone from 7am to 7pm daily and can be reached through the team cell phone.  Should this patient require assistance outside of these hours, please call the patient's attending physician.

## 2022-06-18 NOTE — Assessment & Plan Note (Signed)
LV systolic function decreased, 20 to 25% Patient on comfort care, no signs of volume overload.   Positive signs of demand ischemia.

## 2022-06-18 NOTE — Progress Notes (Signed)
  Progress Note   Patient: Alexander Greene CVE:[id number] DOB: [birth year] DOA: June 27, 2022     7 DOS: the patient was seen and examined on 06/18/2022   Brief hospital course: 66 year old male PMH unknown presented 9/13 after being found down on ground for unknown period of time by roommate. Last was seen two days prior. Patient was admitted on 06/27/2022 with acute metabolic encephalopathy in setting of hypernatremia and severe dehydration, acute rhabdomyolysis, hyperkalemia, demand cardiac ischemia, RLL PE and possible LV thrombus, left common femoral artery occlusion, right common iliac artery occlusion, malnutrition, MSSA bacteremia.  Admitted by critical care, seen in consultation by cardiology for demand ischemia, severe systolic CHF of unknown duration with recommendation for conservative medical management given overall condition and inability to tolerate goal-directed medical therapy.  Not an interventional candidate.  Seen by palliative care.  Goals of care complicated, patient with no family willing to participate in decision-making.  There is a roommate who may be willing to.  Seen by neurology for embolic infarcts with conservative management recommended.  Seen by vascular surgery for bilateral lower extremity ischemia, left leg gangrene requiring AKA, right leg requiring revascularization, fasciotomy and suspected eventual AKA.  Goals of care been difficult and surgery has been delayed in attempt to finalize goals of care.  Assessment and Plan: Cerebrovascular accident (CVA) due to embolism of cerebral artery ([place]) Catastrophic poly-embolic state (lung, bilateral femoral, spleen, brain, kidneys) in setting of MSSA bacteremia; question if clot/septic or both with associated acute metabolic encephalopathy  Acute metabolic encephalopathy Continue comfort measures.   Femoral artery occlusion (HCC) BLE ischemia, nonviable LLE, possible nonviable RLE, sensation still intact in the right leg --  Vascular surgery notes surgery would involve left BKA, right iliofemoral embolectomy and likely AKA. --Discussed with vascular surgery earlier today, need to make a decision whether to proceed with surgery or conservative management.   Acute rhabdomyolysis   Acute combined systolic and diastolic heart failure (HCC) LV systolic function decreased, 20 to 25% Patient on comfort care, no signs of volume overload.   Positive signs of demand ischemia.   DVT (deep venous thrombosis) (HCC) CT chest showed right lower lobe PE --LE venous Doppler DVT in left leg E venous Doppler Findings consistent with acute deep vein thrombosis involving the left popliteal vein, left posterior tibial veins, and left peroneal veins.  Acute pulmonary embolism (HCC) Patient not candidate for anticoagulation   DM type 2 (diabetes mellitus, type 2) (HCC) Hypoglycemia and hyperglycemia.   Protein-calorie malnutrition, severe Poor prognosis.         Subjective: Patient on morphine drip, not interactive   Physical Exam: Vitals:   06/17/22 0643 06/17/22 0753 06/17/22 1251 06/18/22 1147  BP: 99/69 91/72 108/76 120/86  Pulse:  (!) 109 (!) 106 93  Resp:  12 14 (!) 22  Temp:  99.3 F (37.4 C) 98.4 F (36.9 C) 99.5 F (37.5 C)  TempSrc:  Axillary Axillary Oral  SpO2:  91% 100% 100%  Weight:      Height:       Patient not interactive very deconditioned and ill looking appearing  Data Reviewed:    Family Communication: no family at the bedside   Disposition: Status is: Inpatient Remains inpatient appropriate because: imminent death   Planned Discharge Destination:  imminent death       Author: Tawni Millers, MD 06/18/2022 3:48 PM  For on call review [email or web address].

## 2022-06-18 NOTE — Assessment & Plan Note (Signed)
Patient not candidate for anticoagulation

## 2022-06-18 NOTE — Assessment & Plan Note (Signed)
Hypoglycemia and hyperglycemia.

## 2022-06-18 NOTE — Assessment & Plan Note (Signed)
Catastrophic poly-embolic state (lung, bilateral femoral, spleen, brain, kidneys) in setting of MSSA bacteremia; question if clot/septic or bothwith associated acute metabolic encephalopathy  Acute metabolic encephalopathy Continue comfort measures.

## 2022-06-18 NOTE — Assessment & Plan Note (Signed)
Poor prognosis.

## 2022-06-18 NOTE — Assessment & Plan Note (Signed)
CT chest showed right lower lobe PE --LE venous DopplerDVT in left leg E venous DopplerFindings consistent with acute deep vein thrombosis involving the left popliteal vein, left posterior tibial veins, and left peroneal veins.

## 2022-06-29 NOTE — Death Summary Note (Signed)
DEATH SUMMARY   Patient Details  Name: Alexander Greene MRN: [id number] DOB: June 29, 1956 [id number], No Pcp Per Admission/Discharge Information   Admit Date:  06/26/22  Date of Death: Date of Death: 2022/07/04  Time of Death: Time of Death: 0950  Length of Stay: 8   Principle Cause of death: ischemic cerebrovascular accident.   Hospital Diagnoses: Active Problems:   Cerebrovascular accident (CVA) due to embolism of cerebral artery (HCC)   Femoral artery occlusion (HCC)   Acute combined systolic and diastolic heart failure (HCC)   DVT (deep venous thrombosis) ([place])   Acute pulmonary embolism (HCC)   DM type 2 (diabetes mellitus, type 2) (HCC)   Protein-calorie malnutrition, severe   Hospital Course: 66 year old male PMH unknown presented 27-Jun-2023 after being found down on ground for unknown period of time by roommate. Last was seen two days prior. Patient was admitted on 06-26-22 with acute metabolic encephalopathy in setting of hypernatremia and severe dehydration, acute rhabdomyolysis, hyperkalemia, demand cardiac ischemia, RLL PE and possible LV thrombus, left common femoral artery occlusion, right common iliac artery occlusion, malnutrition, MSSA bacteremia.  Admitted by critical care, seen in consultation by cardiology for demand ischemia, severe systolic CHF of unknown duration with recommendation for conservative medical management given overall condition and inability to tolerate goal-directed medical therapy.  Not an interventional candidate.  Seen by palliative care.  Goals of care complicated, patient with no family willing to participate in decision-making.  There is a roommate who may be willing to.  Seen by neurology for embolic infarcts with conservative management recommended.  Seen by vascular surgery for bilateral lower extremity ischemia, left leg gangrene requiring AKA, right leg requiring revascularization, fasciotomy and suspected eventual AKA.  Goals of care been  difficult and surgery has been delayed in attempt to finalize goals of care.  Patient with very poor prognosis.  Patient not able to make decisions due to encephalopathy. Case discussed by the medical team and consultants. Patient with nobody willing to make decision for him. Considering patient condition, decision was made to transitioned to comfort care to prevent any further suffering.   Assessment and Plan: Cerebrovascular accident (CVA) due to embolism of cerebral artery ([place]) Catastrophic poly-embolic state (lung, bilateral femoral, spleen, brain, kidneys) in setting of MSSA bacteremia; question if clot/septic or both with associated acute metabolic encephalopathy  Acute metabolic encephalopathy Continue comfort measures.   Femoral artery occlusion (HCC) BLE ischemia, nonviable LLE, possible nonviable RLE, sensation still intact in the right leg -- Vascular surgery notes surgery would involve left BKA, right iliofemoral embolectomy and likely AKA. --Discussed with vascular surgery earlier today, need to make a decision whether to proceed with surgery or conservative management.   Acute rhabdomyolysis   Acute combined systolic and diastolic heart failure (HCC) LV systolic function decreased, 20 to 25% Patient on comfort care, no signs of volume overload.   Positive signs of demand ischemia.   DVT (deep venous thrombosis) (HCC) CT chest showed right lower lobe PE --LE venous Doppler DVT in left leg E venous Doppler Findings consistent with acute deep vein thrombosis involving the left popliteal vein, left posterior tibial veins, and left peroneal veins.  Acute pulmonary embolism (HCC) Patient not candidate for anticoagulation   DM type 2 (diabetes mellitus, type 2) (HCC) Hypoglycemia and hyperglycemia.   Protein-calorie malnutrition, severe Poor prognosis.           Consultations: critical care, cardiology, neurology, vascular surgery and palliative care.   The  results of significant  diagnostics from this hospitalization (including imaging, microbiology, ancillary and laboratory) are listed below for reference.   Significant Diagnostic Studies: VAS [place] LOWER EXTREMITY VENOUS (DVT)  Result Date: 06/16/2022  Lower Venous DVT Study Patient Name:  Alexander Greene  Date of Exam:   06/08/2022 Medical Rec #: [id number]           Accession #:    [id number] Date of Birth: Aug 08, 1956           Patient Gender: M Patient Age:   13 years Exam Location:  [facility] Procedure:      VAS [place] LOWER EXTREMITY VENOUS (DVT) Referring Phys: Cornelius Moras XU --------------------------------------------------------------------------------  Indications: Pulmonary embolism.  Limitations: Gangrenous left lower extremity with bandaging. Comparison Study: No prior studies. Performing Technologist: Darlin Coco RDMS, RVT  Examination Guidelines: A complete evaluation includes B-mode imaging, spectral Doppler, color Doppler, and power Doppler as needed of all accessible portions of each vessel. Bilateral testing is considered an integral part of a complete examination. Limited examinations for reoccurring indications may be performed as noted. The reflux portion of the exam is performed with the patient in reverse Trendelenburg.  +---------+---------------+---------+-----------+----------+--------------+ RIGHT    CompressibilityPhasicitySpontaneityPropertiesThrombus Aging +---------+---------------+---------+-----------+----------+--------------+ CFV      Full           Yes      Yes                                 +---------+---------------+---------+-----------+----------+--------------+ SFJ      Full                                                        +---------+---------------+---------+-----------+----------+--------------+ FV Prox  Full                                                         +---------+---------------+---------+-----------+----------+--------------+ FV Mid   Full                                                        +---------+---------------+---------+-----------+----------+--------------+ FV DistalFull                                                        +---------+---------------+---------+-----------+----------+--------------+ PFV      Full                                                        +---------+---------------+---------+-----------+----------+--------------+ POP      Full           Yes      Yes                                 +---------+---------------+---------+-----------+----------+--------------+  PTV      Full                                                        +---------+---------------+---------+-----------+----------+--------------+ PERO     Full                                                        +---------+---------------+---------+-----------+----------+--------------+   +---------+---------------+---------+-----------+----------+--------------+ LEFT     CompressibilityPhasicitySpontaneityPropertiesThrombus Aging +---------+---------------+---------+-----------+----------+--------------+ CFV      Full           Yes      Yes                                 +---------+---------------+---------+-----------+----------+--------------+ SFJ      Full                                                        +---------+---------------+---------+-----------+----------+--------------+ FV Prox  Full                                                        +---------+---------------+---------+-----------+----------+--------------+ FV Mid   Full                                                        +---------+---------------+---------+-----------+----------+--------------+ FV DistalFull                                                         +---------+---------------+---------+-----------+----------+--------------+ PFV      Full                                                        +---------+---------------+---------+-----------+----------+--------------+ POP      None           No       No                   Acute          +---------+---------------+---------+-----------+----------+--------------+ PTV      None           No       No                   Acute          +---------+---------------+---------+-----------+----------+--------------+  PERO     None           No       No                   Acute          +---------+---------------+---------+-----------+----------+--------------+     Summary: RIGHT: - There is no evidence of deep vein thrombosis in the lower extremity.  - No cystic structure found in the popliteal fossa.  LEFT: - Findings consistent with acute deep vein thrombosis involving the left popliteal vein, left posterior tibial veins, and left peroneal veins. - No cystic structure found in the popliteal fossa.  *See table(s) above for measurements and observations. Electronically signed by Monica Martinez MD on 06/16/2022 at 2:58:32 PM.    Final    VAS [place] TRANSCRANIAL DOPPLER W BUBBLES  Result Date: 06/16/2022  Transcranial Doppler with Bubble Patient Name:  Alexander Greene  Date of Exam:   06/07/2022 Medical Rec #: [id number]           Accession #:    [id number] Date of Birth: 20-Aug-1956           Patient Gender: M Patient Age:   15 years Exam Location:  [facility] Procedure:      VAS [place] TRANSCRANIAL DOPPLER W BUBBLES Referring Phys: Cornelius Moras XU --------------------------------------------------------------------------------  Indications: Stroke. Limitations: Constant patient movement. Patient unable to follow              commands/perform valsalva. Comparison Study: No prior studies. Performing Technologist: Darlin Coco RDMS, RVT  Examination Guidelines: A complete evaluation includes B-mode  imaging, spectral Doppler, color Doppler, and power Doppler as needed of all accessible portions of each vessel. Bilateral testing is considered an integral part of a complete examination. Limited examinations for reoccurring indications may be performed as noted.  Summary: No HITS at rest or during Valsalva. Negative transcranial Doppler Bubble study with no evidence of right to left intracardiac communication.  A vascular evaluation was performed. The left middle cerebral artery was studied. An IV was inserted into the patient's left forearm. Verbal informed consent was obtained.  No HITS observed at rest. [place], not cooperative with valsalva maneuver. with current observation, negative transcranial Doppler Bubble study with no evidence of right to left intracardiac communication. *See table(s) above for TCD measurements and observations.  Diagnosing physician: Rosalin Hawking MD Electronically signed by Rosalin Hawking MD on 06/16/2022 at 6:23:58 PM.    Final    VAS US CAROTID  Result Date: 06/06/2022 Carotid Arterial Duplex Study Patient Name:  Alexander Greene  Date of Exam:   06/24/2022 Medical Rec #: [id number]           Accession #:    [id number] Date of Birth: 07/23/1956           Patient Gender: M Patient Age:   89 years Exam Location:  [facility] Procedure:      VAS US CAROTID Referring Phys: Alferd Patee [facility] --------------------------------------------------------------------------------  Indications:       CVA. Risk Factors:      None listed. Limitations        Today's exam was limited due to the patient's inability or                    unwillingness to cooperate. Comparison Study:  No prior studies. Performing Technologist: Darlin Coco RDMS, RVT  Examination Guidelines: A complete evaluation includes B-mode imaging, spectral Doppler, color Doppler, and  power Doppler as needed of all accessible portions of each vessel. Bilateral testing is considered an integral part of a complete examination.  Limited examinations for reoccurring indications may be performed as noted.  Right Carotid Findings: +----------+--------+--------+--------+------------------+--------+           PSV cm/sEDV cm/sStenosisPlaque DescriptionComments +----------+--------+--------+--------+------------------+--------+ CCA Prox  62      22                                         +----------+--------+--------+--------+------------------+--------+ CCA Distal60      21                                         +----------+--------+--------+--------+------------------+--------+ ICA Prox  61      31      1-39%   heterogenous               +----------+--------+--------+--------+------------------+--------+ ICA Distal68      32                                         +----------+--------+--------+--------+------------------+--------+ ECA       72      15                                         +----------+--------+--------+--------+------------------+--------+ +----------+--------+-------+----------------+-------------------+           PSV cm/sEDV cmsDescribe        Arm Pressure (mmHG) +----------+--------+-------+----------------+-------------------+ [id number]            Multiphasic, WNL                    +----------+--------+-------+----------------+-------------------+ +---------+--------+--+--------+--+---------+ VertebralPSV cm/s38EDV cm/s13Antegrade +---------+--------+--+--------+--+---------+  Left Carotid Findings: +----------+--------+--------+--------+-----------------------+--------+           PSV cm/sEDV cm/sStenosisPlaque Description     Comments +----------+--------+--------+--------+-----------------------+--------+ CCA Prox  55      17                                              +----------+--------+--------+--------+-----------------------+--------+ CCA Distal75      24              smooth and heterogenous          +----------+--------+--------+--------+-----------------------+--------+ ICA Prox  58      25      1-39%   heterogenous                    +----------+--------+--------+--------+-----------------------+--------+ ICA Distal64      33                                              +----------+--------+--------+--------+-----------------------+--------+ ECA       61      15                                              +----------+--------+--------+--------+-----------------------+--------+ +----------+--------+--------+----------------+-------------------+  PSV cm/sEDV cm/sDescribe        Arm Pressure (mmHG) +----------+--------+--------+----------------+-------------------+ [id number]             Multiphasic, WNL                    +----------+--------+--------+----------------+-------------------+ +---------+--------+--+--------+--+---------+ VertebralPSV cm/s51EDV cm/s24Antegrade +---------+--------+--+--------+--+---------+   Summary: Right Carotid: Velocities in the right ICA are consistent with a 1-39% stenosis. Left Carotid: Velocities in the left ICA are consistent with a 1-39% stenosis. Vertebrals:  Bilateral vertebral arteries demonstrate antegrade flow. Subclavians: Normal flow hemodynamics were seen in bilateral subclavian              arteries. *See table(s) above for measurements and observations.  Electronically signed by Rosalin Hawking MD on 06/06/2022 at 6:21:52 PM.    Final    MR BRAIN WO CONTRAST  Result Date: 06/13/2022 CLINICAL DATA:  Stroke follow-up EXAM: MRI HEAD WITHOUT CONTRAST TECHNIQUE: Multiplanar, multiecho pulse sequences of the brain and surrounding structures were obtained without intravenous contrast. COMPARISON:  CT head 07/01/2022 FINDINGS: Brain: Limited exam. Patient not able to complete all sequences. Images available are degraded by motion. Restricted diffusion and acute infarct in the occipital lobes bilaterally. Small areas of  acute infarct extend into the left parietal lobe and splenium of the corpus callosum. Small acute infarct right posterior frontal white matter. Gradient echo imaging not performed to evaluate for hemorrhage. Ventricle size normal. No midline shift. No significant chronic ischemic changes in the white matter. No mass lesion Vascular: Normal arterial flow voids Skull and upper cervical spine: No focal skeletal abnormality detected Sinuses/Orbits: Negative Other: None IMPRESSION: Limited study. Patient not able to complete the study and not able to hold still. Acute infarct in the occipital lobes bilaterally. This extends into the left parietal lobe and splenium. Small acute infarct right posterior frontal white matter. Electronically Signed   By: Franchot Gallo M.D.   On: 06/13/2022 11:20   ECHOCARDIOGRAM COMPLETE  Result Date: 06/12/2022    ECHOCARDIOGRAM REPORT   Patient Name:   Alexander Greene Date of Exam: 06/12/2022 Medical Rec #:  [id number]          Height:       71.0 in Accession #:    [id number]         Weight:       113.1 lb Date of Birth:  03-Feb-1956          BSA:          1.655 m Patient Age:    7 years           BP:           105/76 mmHg Patient Gender: M                  HR:           96 bpm. Exam Location:  Inpatient Procedure: 2D Echo, Cardiac Doppler, Color Doppler, Intracardiac Opacification            Agent and Saline Contrast Bubble Study Indications:    Pulmonary embolus  History:        Patient has no prior history of Echocardiogram examinations.  Sonographer:    Memory Argue Referring Phys: [place]  1. No mural thrombus with Definity contrast. Left ventricular ejection fraction, by estimation, is 20 to 25%. Left ventricular ejection fraction by 2D MOD biplane is 20.7 %. The left ventricle has severely decreased function. The left ventricle demonstrates  global hypokinesis. There is moderate left ventricular hypertrophy. Left ventricular diastolic parameters  are indeterminate.  2. Right ventricular systolic function is mildly reduced. The right ventricular size is normal. Tricuspid regurgitation signal is inadequate for assessing PA pressure.  3. Left atrial size was severely dilated.  4. The mitral valve is abnormal. Trivial mitral valve regurgitation.  5. The aortic valve is tricuspid. Aortic valve regurgitation is not visualized.  6. Aortic dilatation noted. There is borderline dilatation of the aortic root, measuring 38 mm.  7. The inferior vena cava is normal in size with <50% respiratory variability, suggesting right atrial pressure of 8 mmHg.  8. Agitated saline contrast bubble study was negative, with no evidence of any interatrial shunt. Comparison(s): No prior Echocardiogram. FINDINGS  Left Ventricle: No mural thrombus with Definity contrast. Left ventricular ejection fraction, by estimation, is 20 to 25%. Left ventricular ejection fraction by 2D MOD biplane is 20.7 %. The left ventricle has severely decreased function. The left ventricle demonstrates global hypokinesis. The left ventricular internal cavity size was normal in size. There is moderate left ventricular hypertrophy. Left ventricular diastolic parameters are indeterminate. Right Ventricle: The right ventricular size is normal. No increase in right ventricular wall thickness. Right ventricular systolic function is mildly reduced. Tricuspid regurgitation signal is inadequate for assessing PA pressure. Left Atrium: Left atrial size was severely dilated. Right Atrium: Right atrial size was normal in size. Pericardium: There is no evidence of pericardial effusion. Mitral Valve: The mitral valve is abnormal. There is moderate thickening of the anterior and posterior mitral valve leaflet(s). Trivial mitral valve regurgitation. Tricuspid Valve: The tricuspid valve is grossly normal. Tricuspid valve regurgitation is trivial. Aortic Valve: The aortic valve is tricuspid. Aortic valve regurgitation is not  visualized. Aortic valve mean gradient measures 4.0 mmHg. Aortic valve peak gradient measures 7.3 mmHg. Aortic valve area, by VTI measures 1.84 cm. Pulmonic Valve: The pulmonic valve was normal in structure. Pulmonic valve regurgitation is not visualized. Aorta: Aortic dilatation noted. There is borderline dilatation of the aortic root, measuring 38 mm. Venous: The inferior vena cava is normal in size with less than 50% respiratory variability, suggesting right atrial pressure of 8 mmHg. IAS/Shunts: There is right bowing of the interatrial septum, suggestive of elevated left atrial pressure. No atrial level shunt detected by color flow Doppler. Agitated saline contrast was given intravenously to evaluate for intracardiac shunting. Agitated saline contrast bubble study was negative, with no evidence of any interatrial shunt.  LEFT VENTRICLE PLAX 2D                        Biplane EF (MOD) LVIDd:         5.50 cm         LV Biplane EF:   Left LVIDs:         5.00 cm                          ventricular LV PW:         1.30 cm                          ejection LV IVS:        1.30 cm                          fraction by LVOT diam:     2.20 cm  2D MOD LV SV:         40                               biplane is LV SV Index:   24                               20.7 %. LVOT Area:     3.80 cm                                Diastology                                LV e' medial:    7.18 cm/s LV Volumes (MOD)               LV E/e' medial:  12.6 LV vol d, MOD    157.0 ml      LV e' lateral:   5.44 cm/s A2C:                           LV E/e' lateral: 16.6 LV vol d, MOD    182.0 ml A4C: LV vol s, MOD    130.0 ml A2C: LV vol s, MOD    131.0 ml A4C: LV SV MOD A2C:   27.0 ml LV SV MOD A4C:   182.0 ml LV SV MOD BP:    35.3 ml RIGHT VENTRICLE TAPSE (M-mode): 1.8 cm LEFT ATRIUM              Index        RIGHT ATRIUM           Index LA Vol (A2C):   88.5 ml  53.48 ml/m  RA Area:     13.40 cm LA Vol (A4C):   111.0 ml  67.07 ml/m  RA Volume:   33.00 ml  19.94 ml/m LA Biplane Vol: 100.0 ml 60.43 ml/m  AORTIC VALVE AV Area (Vmax):    2.06 cm AV Area (Vmean):   2.02 cm AV Area (VTI):     1.84 cm AV Vmax:           135.00 cm/s AV Vmean:          97.900 cm/s AV VTI:            0.219 m AV Peak Grad:      7.3 mmHg AV Mean Grad:      4.0 mmHg LVOT Vmax:         73.10 cm/s LVOT Vmean:        52.000 cm/s LVOT VTI:          0.106 m LVOT/AV VTI ratio: 0.48  AORTA Ao Root diam: 3.65 cm Ao Asc diam:  3.20 cm MITRAL VALVE MV Area (PHT): 5.34 cm    SHUNTS MV Decel Time: 142 msec    Systemic VTI:  0.11 m MV E velocity: 90.40 cm/s  Systemic Diam: 2.20 cm MV A velocity: 80.10 cm/s MV E/A ratio:  1.13 Lyman Bishop MD Electronically signed by Lyman Bishop MD Signature Date/Time: 06/12/2022/1:14:27 PM    Final    DG Abd Portable 1V  Result Date: 06/12/2022 CLINICAL DATA:  Altered mental status EXAM:  PORTABLE ABDOMEN - 1 VIEW COMPARISON:  None Available. FINDINGS: Enteric tube passes into the stomach with tip overlying the pyloric region. Bowel gas pattern is unremarkable. IMPRESSION: Enteric tube within the distal stomach. Electronically Signed   By: Macy Mis M.D.   On: 06/12/2022 10:36   DG Chest Port 1 View  Result Date: 06/12/2022 CLINICAL DATA:  Shortness of breath.  Metabolic encephalopathy. EXAM: PORTABLE CHEST 1 VIEW COMPARISON:  Yesterday FINDINGS: Numerous leads and wires project over the chest. Midline trachea. Borderline cardiomegaly. No pleural effusion or pneumothorax. Clear lungs. IMPRESSION: Borderline cardiomegaly, without acute disease. Electronically Signed   By: Abigail Miyamoto M.D.   On: 06/12/2022 08:22   CT CHEST ABDOMEN PELVIS W CONTRAST  Result Date: 05/31/2022 CLINICAL DATA:  A 66 year old male presents with history of sepsis. EXAM: CT CHEST, ABDOMEN, AND PELVIS WITH CONTRAST TECHNIQUE: Multidetector CT imaging of the chest, abdomen and pelvis was performed following the standard protocol during bolus  administration of intravenous contrast. RADIATION DOSE REDUCTION: This exam was performed according to the departmental dose-optimization program which includes automated exposure control, adjustment of the mA and/or kV according to patient size and/or use of iterative reconstruction technique. CONTRAST:  21mL OMNIPAQUE IOHEXOL 350 MG/ML SOLN COMPARISON:  Cervical spine evaluation performed the same date. FINDINGS: CT CHEST FINDINGS Cardiovascular: Low-attenuation about papillary muscles near the LEFT ventricular apex raising the question of small amounts of thrombus adherent to papillary musculature in the LEFT ventricle. LEFT ventricular dilation. No LEFT atrial thrombus. Signs of RIGHT lower lobe pulmonary artery emboli in basilar segmental level branches (image 37/3) Central pulmonary vasculature is normal caliber. The aorta is normal caliber with smooth contour. Mediastinum/Nodes: No signs of adenopathy in the chest. Esophagus is grossly normal. Lungs/Pleura: No lobar consolidation. RIGHT upper lobe pulmonary nodule (image 35/5) 8 x 6 mm. Airways are patent. Small bilateral pleural effusions are present layering dependently in the chest. No pneumothorax. Musculoskeletal: See below for full musculoskeletal details. CT ABDOMEN PELVIS FINDINGS Hepatobiliary: Hepatic steatosis. Smooth hepatic contours. Patent portal vein. No suspicious hepatic lesion. No acute biliary process. Pancreas: Mild atrophy of the pancreas without ductal dilation, inflammation or visible lesion. 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 Adrenals/Urinary Tract: Adrenal glands are normal. 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 Heterogeneity in general of the nephrogram a stray shin seen bilaterally. Striated nephrogram can also be seen in the setting of  pyelonephritis but there is at least 1 area that is strongly suggestive of if not diagnostic for renal infarct. Other areas also show focal characteristics either infarct or focal nephritis. Renal veins are patent. Stomach/Bowel: No stranding adjacent to the stomach. No small bowel obstruction. Mild generalize stranding in the mesentery of the small bowel, of uncertain significance in the setting of small bilateral effusions and body wall edema. This may be developing generalized anasarca. 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 Chronicity uncertain, potentially acute/recent though with reconstitution of flow seen in superficial and deep femoral arteries distal to the site of occlusion in the upper thigh. Also however with occluded superficial femoral artery on the LEFT (image 117/3) No signs of adenopathy in the abdomen or the pelvis. Remaining branches of the abdominal aorta grossly patent this venous phase assessment. Reproductive: Prostate mild to  moderately enlarged. Urinary bladder without focal thickening or gross perivesical stranding. Other: Body wall edema.  No ascites.  No pneumoperitoneum. Musculoskeletal: No acute bone finding. No destructive bone process. Spinal degenerative changes. IMPRESSION: 1. RIGHT lower lobe pulmonary artery emboli in basilar segmental level branches. 2. 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 Given focal appearance of some of these areas would suggest 3 to six-month follow-up to ensure no underlying lesion is present. 3. 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  Findings are suspicious for recent and or acute occlusion superimposed on chronic vascular disease. 4. 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 Vascular surgery consultation is suggested. 5. 7 mm right solid pulmonary nodule within the upper lobe. Per Fleischner Society Guidelines, recommend a non-contrast Chest CT at 6-12 months. If patient is high risk for malignancy, recommend an additional non-contrast Chest CT at 18-24 months; if patient is low risk for malignancy a non-contrast Chest CT at 18-24 months is optional. These guidelines do not apply to immunocompromised patients and patients with cancer. Follow up in patients with significant comorbidities as clinically warranted. For lung cancer screening, adhere to Lung-RADS guidelines. Reference: Radiology. 2017; 284(1):228-43. Critical Value/emergent results were called by telephone at the time of interpretation on 06/08/2022 at 3:01 pm to provider [facility] , who verbally acknowledged these results. Electronically Signed   By: Zetta Bills M.D.   On: 06/26/2022 15:01   CT Head Wo Contrast  Result Date: 05/31/2022 CLINICAL DATA:  Altered mental status, neck injury. EXAM: CT HEAD WITHOUT CONTRAST CT CERVICAL SPINE WITHOUT CONTRAST TECHNIQUE: Multidetector CT imaging of the head and cervical spine was performed following the standard protocol without intravenous contrast. Multiplanar CT image reconstructions of the cervical spine were also generated. Unfortunately, exam is limited due to patient motion artifact. RADIATION DOSE REDUCTION: This exam was performed according to the departmental dose-optimization program which includes automated exposure control, adjustment of the mA and/or kV according to patient size and/or use of iterative reconstruction technique. COMPARISON:  April 05, 2021. FINDINGS: CT HEAD FINDINGS Brain: No evidence of acute  infarction, hemorrhage, hydrocephalus, extra-axial collection or mass lesion/mass effect. Vascular: No hyperdense vessel or unexpected calcification. Skull: Normal. Negative for fracture or focal lesion. Sinuses/Orbits: No acute finding. Other: None. CT CERVICAL SPINE FINDINGS Alignment: Normal. Skull base and vertebrae: Only the first 6 cervical vertebra are well visualized due to patient motion artifact. No fracture or bony abnormality is involving these. Pathology involving C7 cannot be excluded on the basis of this exam. Soft tissues and spinal canal: No prevertebral fluid or swelling seen in visualized areas. No visible canal hematoma. Disc levels:  Moderate degenerative disc disease is noted at C3-4. Upper chest: Not visualized due to patient motion artifact. Other: None. IMPRESSION: No definite acute intracranial abnormality seen. Due to patient motion artifact, only the first 6 of cervical vertebra are well visualized. No fracture or spondylolisthesis is seen involving these visualized vertebra. Fracture or other pathology involving C7 vertebra cannot be excluded on the basis of this exam. Electronically Signed   By: Marijo Conception M.D.   On: 06/23/2022 13:21   CT  Cervical Spine Wo Contrast  Result Date: 06/28/2022 CLINICAL DATA:  Altered mental status, neck injury. EXAM: CT HEAD WITHOUT CONTRAST CT CERVICAL SPINE WITHOUT CONTRAST TECHNIQUE: Multidetector CT imaging of the head and cervical spine was performed following the standard protocol without intravenous contrast. Multiplanar CT image reconstructions of the cervical spine were also generated. Unfortunately, exam is limited due to patient motion artifact. RADIATION DOSE REDUCTION: This exam was performed according to the departmental dose-optimization program which includes automated exposure control, adjustment of the mA and/or kV according to patient size and/or use of iterative reconstruction technique. COMPARISON:  April 05, 2021. FINDINGS: CT  HEAD FINDINGS Brain: No evidence of acute infarction, hemorrhage, hydrocephalus, extra-axial collection or mass lesion/mass effect. Vascular: No hyperdense vessel or unexpected calcification. Skull: Normal. Negative for fracture or focal lesion. Sinuses/Orbits: No acute finding. Other: None. CT CERVICAL SPINE FINDINGS Alignment: Normal. Skull base and vertebrae: Only the first 6 cervical vertebra are well visualized due to patient motion artifact. No fracture or bony abnormality is involving these. Pathology involving C7 cannot be excluded on the basis of this exam. Soft tissues and spinal canal: No prevertebral fluid or swelling seen in visualized areas. No visible canal hematoma. Disc levels:  Moderate degenerative disc disease is noted at C3-4. Upper chest: Not visualized due to patient motion artifact. Other: None. IMPRESSION: No definite acute intracranial abnormality seen. Due to patient motion artifact, only the first 6 of cervical vertebra are well visualized. No fracture or spondylolisthesis is seen involving these visualized vertebra. Fracture or other pathology involving C7 vertebra cannot be excluded on the basis of this exam. Electronically Signed   By: Marijo Conception M.D.   On: 06/04/2022 13:21   DG Chest Port 1 View  Result Date: 06/01/2022 : Questionable sepsis - evaluate for abnormality EXAM: PORTABLE CHEST 1 VIEW COMPARISON:  Chest x-ray 04/05/2021. FINDINGS: Mild linear right basilar opacity. No confluent consolidation. No visible pleural effusions pneumothorax pre cardiomediastinal silhouette is within normal limits when accounting for technique soft were taken. IMPRESSION: Mild linear right basilar opacity, favor subsegmental atelectasis. No confluent consolidation. Electronically Signed   By: Margaretha Sheffield M.D.   On: 06/12/2022 11:41    Microbiology: Recent Results (from the past 240 hour(s))  Blood Culture (routine x 2)     Status: Abnormal   Collection Time: 06/08/2022 11:24 AM    Specimen: BLOOD RIGHT FOREARM  Result Value Ref Range Status   Specimen Description BLOOD RIGHT FOREARM  Final   Special Requests   Final    BOTTLES DRAWN AEROBIC AND ANAEROBIC Blood Culture adequate volume   Culture  Setup Time   Final    IN BOTH AEROBIC AND ANAEROBIC BOTTLES GRAM POSITIVE COCCI IN CLUSTERS CRITICAL RESULT CALLED TO, READ BACK BY AND VERIFIED WITH:  C/ PHARMD M. BITONTI 06/12/22 0456 A. LAFRANCE Performed at [facility], [place] [address] [place], [place] [address]    Culture (A)  Final    STAPHYLOCOCCUS HOMINIS CORRECTED RESULTS CALLED TO: J. FRENS PHARMD 06/17/22 CALLED AND NOTIFIED OF RESULT CORRECTION PREVIOUSLY REPORTED AS: STAPHYLOCOCCUS EPIDERMIDIS    Report Status 06/18/2022 FINAL  Final   Organism ID, Bacteria STAPHYLOCOCCUS HOMINIS  Final      Susceptibility   Staphylococcus hominis - MIC*    CIPROFLOXACIN <=0.5 SENSITIVE Sensitive     ERYTHROMYCIN <=0.25 SENSITIVE Sensitive     GENTAMICIN <=0.5 SENSITIVE Sensitive     OXACILLIN 0.5 RESISTANT Resistant     TETRACYCLINE <=1 SENSITIVE Sensitive  VANCOMYCIN 1 SENSITIVE Sensitive     TRIMETH/SULFA <=10 SENSITIVE Sensitive     CLINDAMYCIN <=0.25 SENSITIVE Sensitive     RIFAMPIN <=0.5 SENSITIVE Sensitive     Inducible Clindamycin NEGATIVE Sensitive     * STAPHYLOCOCCUS HOMINIS  Resp Panel by RT-PCR (Flu A&B, Covid) Anterior Nasal Swab     Status: None   Collection Time: 06/09/2022 11:24 AM   Specimen: Anterior Nasal Swab  Result Value Ref Range Status   SARS Coronavirus 2 by RT PCR NEGATIVE NEGATIVE Final    Comment: (NOTE) SARS-CoV-2 target nucleic acids are NOT DETECTED.  The SARS-CoV-2 RNA is generally detectable in upper respiratory specimens during the acute phase of infection. The lowest concentration of SARS-CoV-2 viral copies this assay can detect is 138 copies/mL. A negative result does not preclude SARS-Cov-2 infection and should not be used as the sole basis for treatment or other  patient management decisions. A negative result may occur with  improper specimen collection/handling, submission of specimen other than nasopharyngeal swab, presence of viral mutation(s) within the areas targeted by this assay, and inadequate number of viral copies(<138 copies/mL). A negative result must be combined with clinical observations, patient history, and epidemiological information. The expected result is Negative.  Fact Sheet for Patients:  BloggerCourse.com  Fact Sheet for Healthcare Providers:  SeriousBroker.it  This test is no t yet approved or cleared by the [place] FDA and  has been authorized for detection and/or diagnosis of SARS-CoV-2 by FDA under an Emergency Use Authorization (EUA). This EUA will remain  in effect (meaning this test can be used) for the duration of the COVID-19 declaration under Section 564(b)(1) of the Act, 21 U.S.C.section 360bbb-3(b)(1), unless the authorization is terminated  or revoked sooner.       Influenza A by PCR NEGATIVE NEGATIVE Final   Influenza B by PCR NEGATIVE NEGATIVE Final    Comment: (NOTE) The Xpert Xpress SARS-CoV-2/FLU/RSV plus assay is intended as an aid in the diagnosis of influenza from Nasopharyngeal swab specimens and should not be used as a sole basis for treatment. Nasal washings and aspirates are unacceptable for Xpert Xpress SARS-CoV-2/FLU/RSV testing.  Fact Sheet for Patients: BloggerCourse.com  Fact Sheet for Healthcare Providers: SeriousBroker.it  This test is not yet approved or cleared by the [place] FDA and has been authorized for detection and/or diagnosis of SARS-CoV-2 by FDA under an Emergency Use Authorization (EUA). This EUA will remain in effect (meaning this test can be used) for the duration of the COVID-19 declaration under Section 564(b)(1) of the Act, 21 U.S.C. section  360bbb-3(b)(1), unless the authorization is terminated or revoked.  Performed at [facility], [address], [place], [place] [address]   Blood Culture ID Panel (Reflexed)     Status: Abnormal   Collection Time: 06/13/2022 11:24 AM  Result Value Ref Range Status   Enterococcus faecalis NOT DETECTED NOT DETECTED Final   Enterococcus Faecium NOT DETECTED NOT DETECTED Final   Listeria monocytogenes NOT DETECTED NOT DETECTED Final   Staphylococcus species DETECTED (A) NOT DETECTED Final    Comment: CRITICAL RESULT CALLED TO, READ BACK BY AND VERIFIED WITH:  C/ PHARMD M. BITONTI 06/12/22 0456 A. LAFRANCE    Staphylococcus aureus (BCID) NOT DETECTED NOT DETECTED Final   Staphylococcus epidermidis DETECTED (A) NOT DETECTED Final    Comment: CRITICAL RESULT CALLED TO, READ BACK BY AND VERIFIED WITH:  C/ PHARMD M. BITONTI 06/12/22 0456 A. LAFRANCE    Staphylococcus lugdunensis NOT DETECTED NOT  DETECTED Final   Streptococcus species NOT DETECTED NOT DETECTED Final   Streptococcus agalactiae NOT DETECTED NOT DETECTED Final   Streptococcus pneumoniae NOT DETECTED NOT DETECTED Final   Streptococcus pyogenes NOT DETECTED NOT DETECTED Final   A.calcoaceticus-baumannii NOT DETECTED NOT DETECTED Final   Bacteroides fragilis NOT DETECTED NOT DETECTED Final   Enterobacterales NOT DETECTED NOT DETECTED Final   Enterobacter cloacae complex NOT DETECTED NOT DETECTED Final   Escherichia coli NOT DETECTED NOT DETECTED Final   Klebsiella aerogenes NOT DETECTED NOT DETECTED Final   Klebsiella oxytoca NOT DETECTED NOT DETECTED Final   Klebsiella pneumoniae NOT DETECTED NOT DETECTED Final   Proteus species NOT DETECTED NOT DETECTED Final   Salmonella species NOT DETECTED NOT DETECTED Final   Serratia marcescens NOT DETECTED NOT DETECTED Final   Haemophilus influenzae NOT DETECTED NOT DETECTED Final   Neisseria meningitidis NOT DETECTED NOT DETECTED Final   Pseudomonas aeruginosa NOT DETECTED NOT  DETECTED Final   Stenotrophomonas maltophilia NOT DETECTED NOT DETECTED Final   Candida albicans NOT DETECTED NOT DETECTED Final   Candida auris NOT DETECTED NOT DETECTED Final   Candida glabrata NOT DETECTED NOT DETECTED Final   Candida krusei NOT DETECTED NOT DETECTED Final   Candida parapsilosis NOT DETECTED NOT DETECTED Final   Candida tropicalis NOT DETECTED NOT DETECTED Final   Cryptococcus neoformans/gattii NOT DETECTED NOT DETECTED Final   Methicillin resistance mecA/C NOT DETECTED NOT DETECTED Final    Comment: Performed at [facility], [address] [place], [place] [address]  Blood Culture (routine x 2)     Status: Abnormal   Collection Time: 06/23/2022 11:29 AM   Specimen: BLOOD  Result Value Ref Range Status   Specimen Description BLOOD SITE NOT SPECIFIED  Final   Special Requests   Final    BOTTLES DRAWN AEROBIC AND ANAEROBIC Blood Culture adequate volume   Culture  Setup Time   Final    IN BOTH AEROBIC AND ANAEROBIC BOTTLES GRAM POSITIVE COCCI IN CLUSTERS CRITICAL VALUE NOTED.  VALUE IS CONSISTENT WITH PREVIOUSLY REPORTED AND CALLED VALUE.    Culture (A)  Final    STAPHYLOCOCCUS EPIDERMIDIS STAPHYLOCOCCUS SCIURI THE SIGNIFICANCE OF ISOLATING THIS ORGANISM FROM A SINGLE SET OF BLOOD CULTURES WHEN MULTIPLE SETS ARE DRAWN IS UNCERTAIN. PLEASE NOTIFY THE MICROBIOLOGY DEPARTMENT WITHIN ONE WEEK IF SPECIATION AND SENSITIVITIES ARE REQUIRED. Performed at [facility], [address] [place], [place] [address]    Report Status 06/18/2022 FINAL  Final   Organism ID, Bacteria STAPHYLOCOCCUS EPIDERMIDIS  Final      Susceptibility   Staphylococcus epidermidis - MIC*    CIPROFLOXACIN <=0.5 SENSITIVE Sensitive     ERYTHROMYCIN <=0.25 SENSITIVE Sensitive     GENTAMICIN <=0.5 SENSITIVE Sensitive     OXACILLIN <=0.25 SENSITIVE Sensitive     TETRACYCLINE 2 SENSITIVE Sensitive     VANCOMYCIN 2 SENSITIVE Sensitive     TRIMETH/SULFA <=10 SENSITIVE Sensitive      CLINDAMYCIN <=0.25 SENSITIVE Sensitive     RIFAMPIN <=0.5 SENSITIVE Sensitive     Inducible Clindamycin NEGATIVE Sensitive     * STAPHYLOCOCCUS EPIDERMIDIS  Surgical PCR screen     Status: None   Collection Time: 06/14/2022 10:59 PM   Specimen: Nasal Mucosa; Nasal Swab  Result Value Ref Range Status   MRSA, PCR NEGATIVE NEGATIVE Final   Staphylococcus aureus NEGATIVE NEGATIVE Final    Comment: (NOTE) The Xpert SA Assay (FDA approved for NASAL specimens in patients 522 years of age and  older), is one component of a comprehensive surveillance program. It is not intended to diagnose infection nor to guide or monitor treatment. Performed at [facility], [place] [address], [place], [place] [address]   Culture, blood (Routine X 2) w Reflex to ID Panel     Status: None   Collection Time: 06/13/22 12:17 PM   Specimen: BLOOD  Result Value Ref Range Status   Specimen Description BLOOD RIGHT ANTECUBITAL  Final   Special Requests   Final    BOTTLES DRAWN AEROBIC AND ANAEROBIC Blood Culture adequate volume   Culture   Final    NO GROWTH 5 DAYS Performed at [facility], [place] [address] [place], [place] [address]    Report Status 06/18/2022 FINAL  Final  Culture, blood (Routine X 2) w Reflex to ID Panel     Status: None   Collection Time: 06/13/22 12:17 PM   Specimen: BLOOD RIGHT HAND  Result Value Ref Range Status   Specimen Description BLOOD RIGHT HAND  Final   Special Requests   Final    BOTTLES DRAWN AEROBIC AND ANAEROBIC Blood Culture results may not be optimal due to an inadequate volume of blood received in culture bottles   Culture   Final    NO GROWTH 5 DAYS Performed at [facility], [place] [address], [place], [place] [address]    Report Status 06/18/2022 FINAL  Final    Signed: Tawni Millers, MD

## 2022-06-29 NOTE — Progress Notes (Signed)
Patient Alexander Greene      DOB: November 10, 1955      LKJ:[id number]      Palliative Medicine Team    Bedside assessment of patient completed, infusions turned off at this time due to patient condition. Bedside RN notified to come to patient room and pronounce patient.     Thank you for allowing the Palliative Medicine Team to assist in the care of this patient.     Damian Leavell, MSN, [facility] Team Team Phone: [phone]  This phone is monitored 7a-7p, please reach out to attending physician outside of these hours for urgent needs.

## 2022-06-29 NOTE — Plan of Care (Signed)
  Problem: Education: Goal: Knowledge of General Education information will improve Description: Including pain rating scale, medication(s)/side effects and non-pharmacologic comfort measures Outcome: Not Progressing   Problem: Health Behavior/Discharge Planning: Goal: Ability to manage health-related needs will improve Outcome: Not Progressing   Problem: Clinical Measurements: Goal: Ability to maintain clinical measurements within normal limits will improve Outcome: Not Progressing Goal: Will remain free from infection Outcome: Not Progressing Goal: Diagnostic test results will improve Outcome: Not Progressing Goal: Respiratory complications will improve Outcome: Not Progressing Goal: Cardiovascular complication will be avoided Outcome: Not Progressing   Problem: Activity: Goal: Risk for activity intolerance will decrease Outcome: Not Progressing   Problem: Nutrition: Goal: Adequate nutrition will be maintained Outcome: Not Progressing   Problem: Coping: Goal: Level of anxiety will decrease Outcome: Not Progressing   Problem: Elimination: Goal: Will not experience complications related to bowel motility Outcome: Not Progressing Goal: Will not experience complications related to urinary retention Outcome: Not Progressing   Problem: Pain Managment: Goal: General experience of comfort will improve Outcome: Not Progressing   Problem: Safety: Goal: Ability to remain free from injury will improve Outcome: Not Progressing   Problem: Skin Integrity: Goal: Risk for impaired skin integrity will decrease Outcome: Not Progressing   Problem: Safety: Goal: Non-violent Restraint(s) Outcome: Not Progressing   Problem: Education: Goal: Knowledge of the prescribed therapeutic regimen will improve Outcome: Not Progressing   Problem: Coping: Goal: Ability to identify and develop effective coping behavior will improve Outcome: Not Progressing   Problem: Clinical  Measurements: Goal: Quality of life will improve Outcome: Not Progressing   Problem: Respiratory: Goal: Verbalizations of increased ease of respirations will increase Outcome: Not Progressing   Problem: Role Relationship: Goal: Family's ability to cope with current situation will improve Outcome: Not Progressing Goal: Ability to verbalize concerns, feelings, and thoughts to partner or family member will improve Outcome: Not Progressing   Problem: Pain Management: Goal: Satisfaction with pain management regimen will improve Outcome: Not Progressing   

## 2022-06-29 NOTE — Progress Notes (Signed)
Morphine drip was wasted with charge RN 9-21 at 3:00pm

## 2022-06-29 NOTE — Progress Notes (Signed)
Attempted to call Terrence Dupont (relative) and [name] (friend). Phones not working.  Gwendolyn Grant, RN

## 2022-06-29 DEATH — deceased
